# Patient Record
Sex: Male | Born: 1955 | Race: White | Hispanic: No | Smoking: Never smoker
Health system: Southern US, Community
[De-identification: ages and names within clinical notes are randomized; demographics above are authoritative.]

## PROBLEM LIST (undated history)

## (undated) DIAGNOSIS — F419 Anxiety disorder, unspecified: Secondary | ICD-10-CM

## (undated) DIAGNOSIS — F32A Depression, unspecified: Secondary | ICD-10-CM

## (undated) DIAGNOSIS — F329 Major depressive disorder, single episode, unspecified: Secondary | ICD-10-CM

## (undated) HISTORY — PX: HERNIA REPAIR: SHX51

---

## 2002-10-10 ENCOUNTER — Ambulatory Visit (HOSPITAL_COMMUNITY): Admission: RE | Admit: 2002-10-10 | Discharge: 2002-10-10 | Payer: Self-pay | Admitting: Internal Medicine

## 2006-06-03 ENCOUNTER — Observation Stay (HOSPITAL_COMMUNITY): Admission: EM | Admit: 2006-06-03 | Discharge: 2006-06-04 | Payer: Self-pay | Admitting: Emergency Medicine

## 2007-06-21 ENCOUNTER — Ambulatory Visit (HOSPITAL_COMMUNITY): Admission: RE | Admit: 2007-06-21 | Discharge: 2007-06-21 | Payer: Self-pay | Admitting: Family Medicine

## 2009-03-29 IMAGING — US US ABDOMEN COMPLETE
1 series · 14 of 25 positions shown · non-contrast
Comparison: none

HISTORY: Epigastric pain and pressure

ULTRASOUND ABDOMEN COMPLETE:
TECHNIQUE: Complete abdominal ultrasound examination was performed including
evaluation of the liver, gallbladder, bile ducts, pancreas, kidneys, spleen,
IVC, and abdominal aorta.

[Series 1: unknown · 0.37mm/px · 14 of 56 slices shown]
[im 1/56]
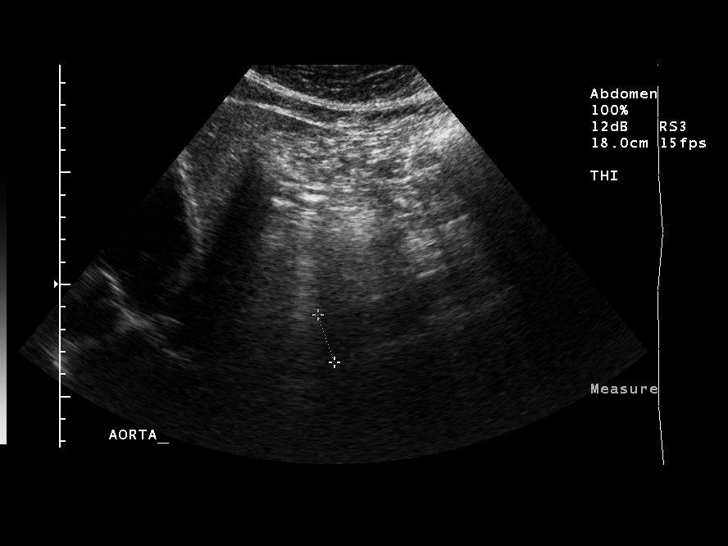
[im 5/56]
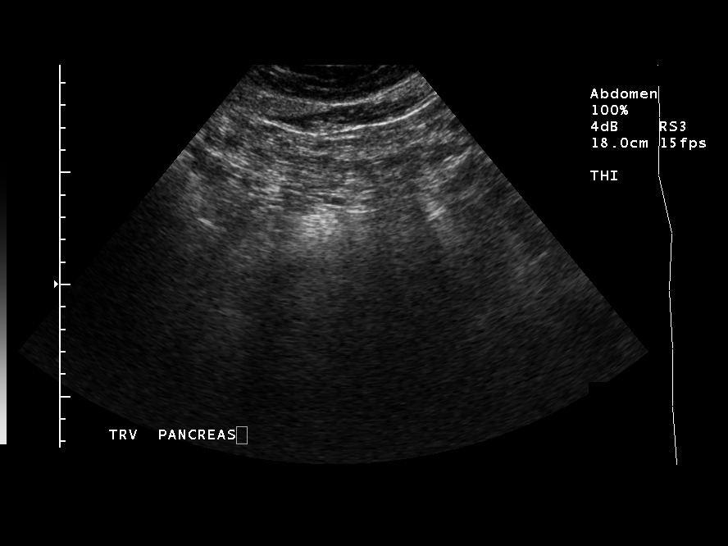
[im 10/56]
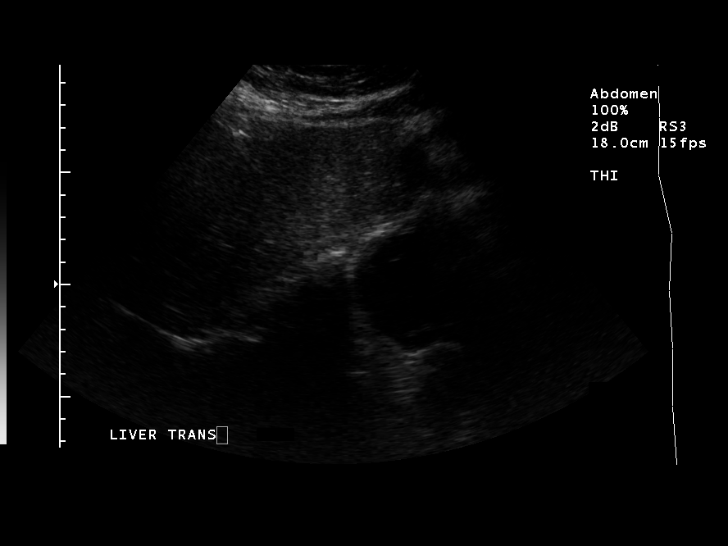
[im 14/56]
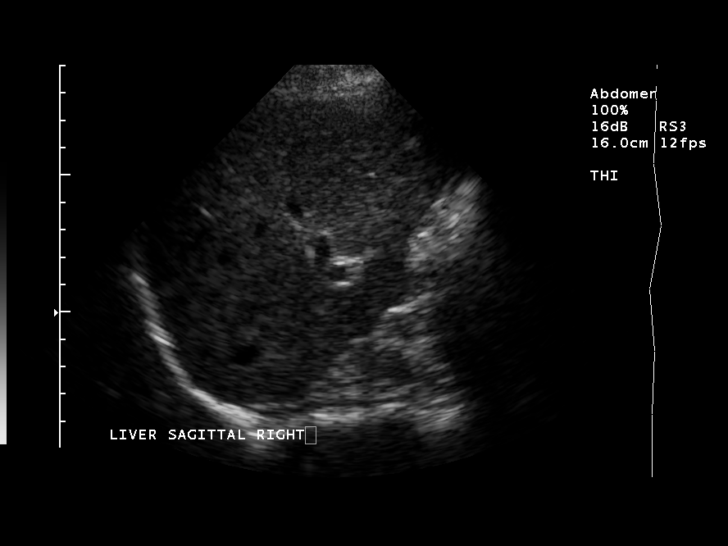
[im 19/56]
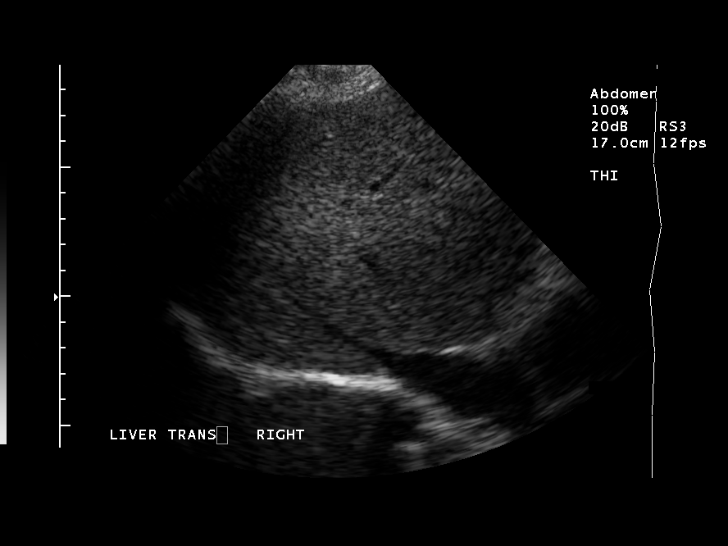
[im 21/56]
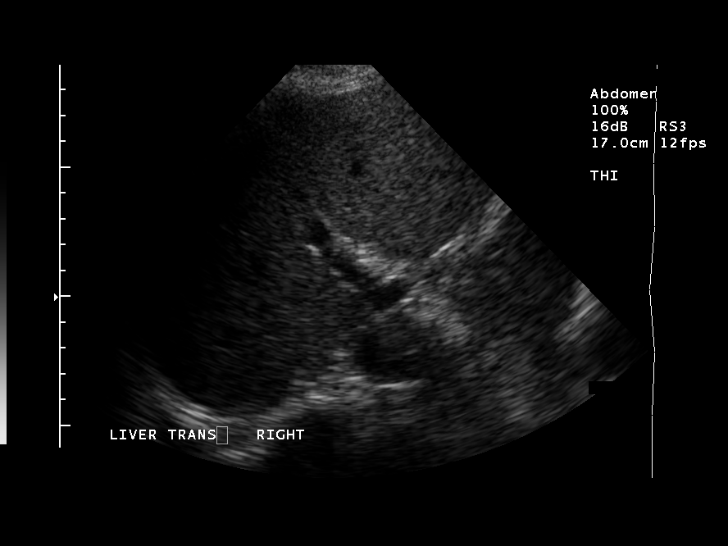
[im 26/56]
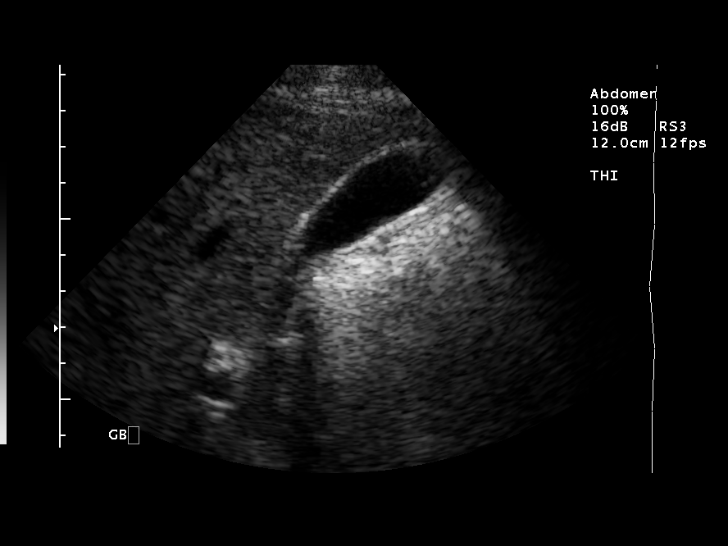
[im 30/56]
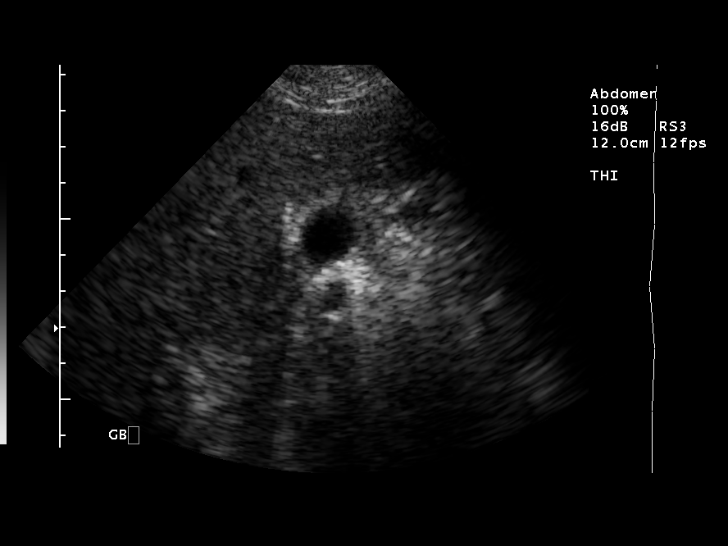
[im 35/56]
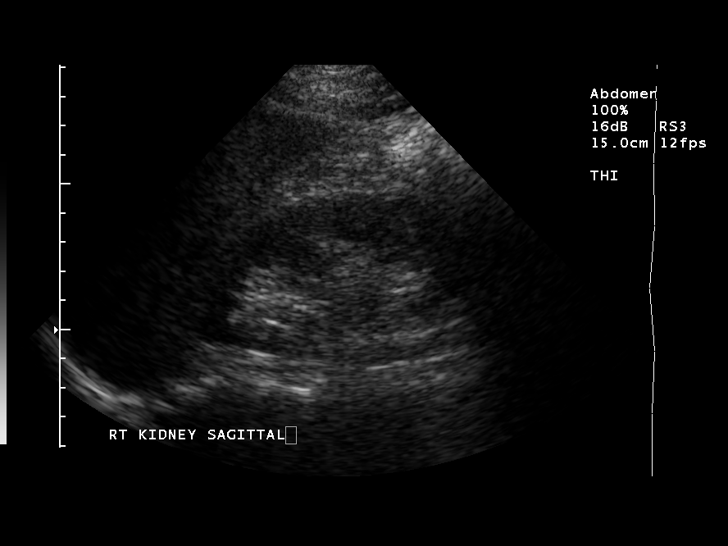
[im 37/56]
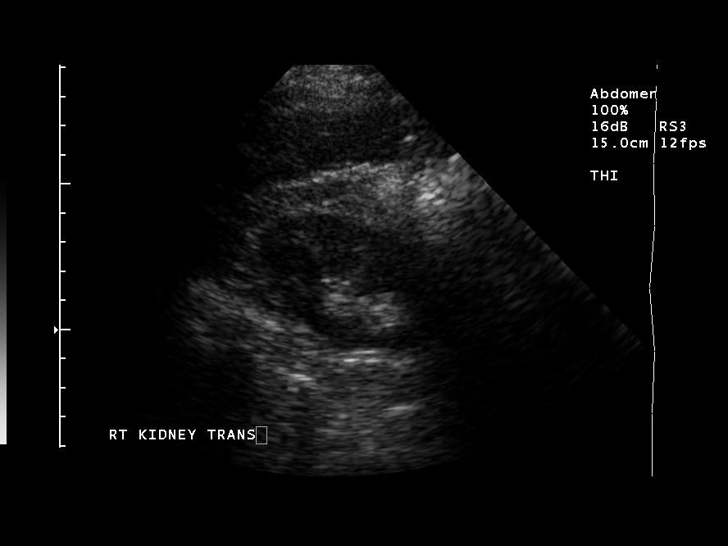
[im 42/56]
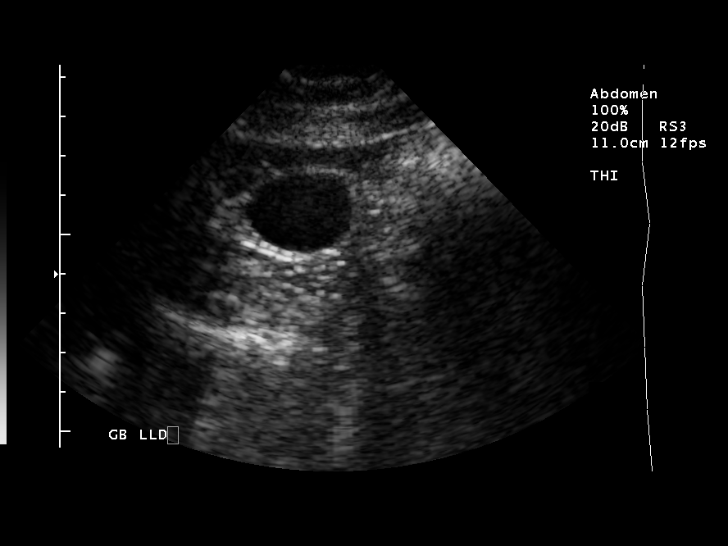
[im 46/56]
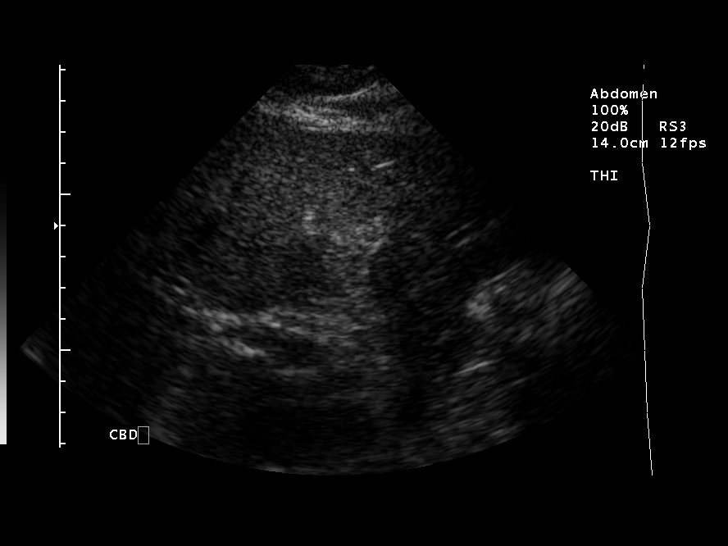
[im 51/56]
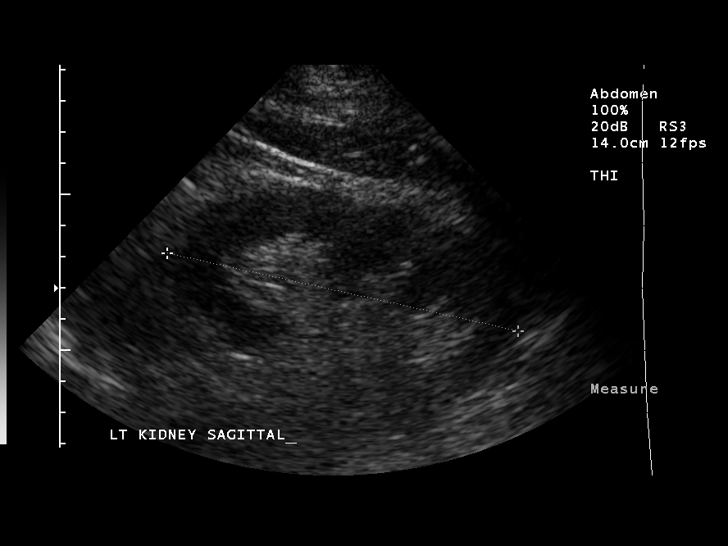
[im 56/56]
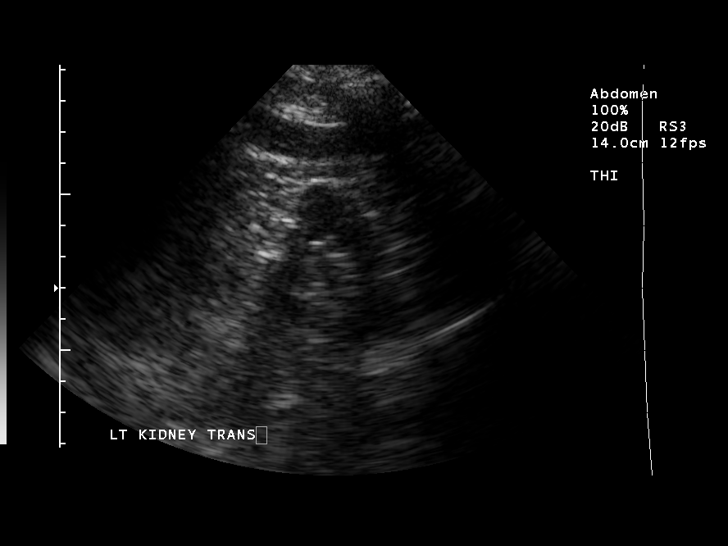

[14 of 25 positions shown; findings below may reference images not displayed]

Gallbladder normal without stones or wall thickening.
Common bile duct normal caliber 4 mm diameter.
Minimally increased hepatic echogenicity, question fatty infiltration, though
this can be seen with cirrhosis and certain infiltrative disorders.
Liver and spleen unremarkable, spleen 9.0 cm length.
Pancreas poorly visualized due to bowel gas.
Aorta and IVC normal.
Kidneys normal size and morphology, 11.6 cm length right and 11.5 cm length
left.
No free fluid.
IMPRESSION: Question mild fatty infiltration of liver.
Suboptimal pancreatic assessment.
No acute upper abdominal abnormalities.

## 2011-03-12 NOTE — Op Note (Signed)
NAME:  Peter Barajas, Peter Barajas                           ACCOUNT NO.:  [id number]   MEDICAL RECORD NO.:  [id number]                   PATIENT TYPE:  AMB   LOCATION:  DAY                                  FACILITY:  [facility]   PHYSICIAN:  R. Roetta Sessions, M.D.              DATE OF BIRTH:  [birth year]   DATE OF PROCEDURE:  10/10/2002  DATE OF DISCHARGE:                                 OPERATIVE REPORT   PROCEDURE:  Screening colonoscopy.   ENDOSCOPIST:  Gerrit Friends. Rourk, M.D.   INDICATIONS FOR PROCEDURE:  The patient is a 55 year old gentleman here for  intermittent low volume rectal bleeding.  He is Hemoccult positive.  He does  not have any upper GI tract symptoms.  He has not had any melena.  On  09/25/02 the CBC was completely normal through Dr. Alonza Smoker office.  H&H is  14.0 and 42.1, MCV is 85.2.  Colonoscopy is now being done to further  evaluate hematochezia.  This approach has been discussed with the patient.  The potential risks, benefits, and alternatives have been reviewed,  questions answered and he is agreeable.  ASA 1.  Please see my dictated H&P.   DESCRIPTION OF PROCEDURE:  O2 saturation, blood pressure, pulse and  respirations were monitored throughout the entire procedure.   CONSCIOUS SEDATION:  Versed 4 mg IV, Demerol 75 mg IV in divided doses.   INSTRUMENT:  Olympus video chip adult colonoscope.   FINDINGS:  Digital rectal exam revealed no abnormalities.   ENDOSCOPIC FINDINGS:  The prep was good.   RECTUM:  Examination of the rectal mucosa including the retroflex view of  the anal verge and en face view of the anal canal revealed some friable  internal hemorrhoidal tissue.  Otherwise the rectal mucosa appeared normal.   COLON:  The colonic mucosa was surveyed from the rectosigmoid junction  through the left transverse and right colon to the area of the appendiceal  orifice, ileocecal valve, and cecum.  These structures were well seen and  photographed for the record.   The patient was noted to have scattered left-  sided diverticula.  The remainder of the colonic mucosa appeared normal.   From the level of the cecum and ileocecal valve the scope was slowly  withdrawn.  All previously mentioned mucosal surfaces were again seen; and,  again, no other abnormalities were observed.  The patient tolerated the  procedure well and was reacted in endoscopy.   IMPRESSION:  1. Friable anal canal/internal hemorrhoids, otherwise normal rectum.  2. Left-sided diverticulum.  The remainder of the colonic mucosa appeared     normal.   DISCUSSION:  I suspect that the patient has bleed from hemorrhoids  intermittently recently.   RECOMMENDATIONS:  1. Diverticulosis and hemorrhoid literature provided to the patient.  2. A 10-day course of Anusol HC suppositories, 1 per rectum at bedtime.  3. Follow up with Dr. Ouida Sills.  Jonathon Bellows, M.D.    RMR/MEDQ  D:  10/10/2002  T:  10/10/2002  Job:  [id number]   cc:   Kingsley Callander. Ouida Sills, M.D.  [address]  [place]  [place] [address]  Fax: [phone]

## 2011-03-12 NOTE — Consult Note (Signed)
NAME:  Marquese, Burkland NO.:  [id number]   MEDICAL RECORD NO.:  [id number]                  PATIENT TYPE:   LOCATION:                                       FACILITY:   PHYSICIAN:  R. Roetta Sessions, M.D.              DATE OF BIRTH:  06-15-1956   DATE OF CONSULTATION:  10/03/2002  DATE OF DISCHARGE:                                   CONSULTATION   REASON FOR CONSULTATION:  Hematochezia, heme positive.   HISTORY OF PRESENT ILLNESS:  Mr. Lenvil Swaim is a 55 year old gentleman,  kindly referred by courtesy of  Dr. Carylon Perches to evaluate him for  intermittent  rectal bleeding over the past couple of months, was hemoccult  positive on rectal exam, had a small external hemorrhoid per Dr. Ouida Sills.  Mr.  Ihnen does not have diarrhea, constipation, has one formed bowel movement  daily, no abdominal pain or change in weight.  No upper GI tract symptoms  such as odynophagia, dysphagia or acid reflux symptoms, nausea or vomiting.  There is no family history of colorectal neoplasia.  There is no history of  gastrointestinal disease.  He does take ibuprofen and aspirin-containing  products on a regular basis, sometimes out of habit, but never takes more  than 1 dose daily per his report.  There has been no tarry or black stools.   PAST MEDICAL HISTORY:  Unremarkable for chronic illness.   PAST SURGICAL HISTORY:  None.   ALLERGIES:  No known drug allergies   CURRENT MEDICATIONS:  Over-the-counter agents at various times, Robitussin,  acetaminophen, dextromethorphan, Sudafed Severe Cold Remedy, Excedrin Quick,  caffeine tablets, ibuprofen, Alka Seltzer, St. John's Wort, lysine.   SOCIAL HISTORY:  The patient has been married for 18 years, has 4 children,  and he has worked in the [employer] for 20 years (is unemployed).  He  does not tobacco or alcohol abuse.   FAMILY HISTORY:  Mother and father are alive and in fairly good health, no  history of GI or  liver illness.   REVIEW OF SYSTEMS:  No chest pain or dyspnea on exertion, no change in  weight, no fever, chills.   PHYSICAL EXAMINATION:  A pleasant 55 year old gentleman resting resting comfortably.  He is bearded.  Weight 176, height 5 feet 10 inches, temperature 98.6, BP  120/70, pulse 72.  SKIN:  Warm and dry, no lesions. No cutaneous stigmata of chronic liver  disease.  HEENT EXAM:  Nares clear.  __________ not prominent.  CHEST:  Lungs are clear to auscultation.  CARDIAC EXAM:  Regular rate and rhythm without murmurs, rubs or gallops.  ABDOMEN:  Nondistended, positive bowel sounds, soft, nontender.  No  hepatosplenomegaly.  EXTREMITIES:  No edema.  RECTAL EXAM:  Deferred to time of colonoscopy.   IMPRESSION:  Alert, pleasant 55 year old gentleman with with intermittent small  amount of hematochezia, hemoccult positive on digital rectal  examination  needs to have a colonoscopy.  Discussed the procedure with Mr. Shives,  potential risks, benefits, alternatives __________ , questions answered.  We  will plan to perform colonoscopy in the near future at [facility].  Further recommendations to follow.   Thanks to Dr. Carylon Perches for letting me see this nice gentleman today.   ADDENDUM:  From Dr. Alonza Smoker office 09/25/2002.  CBC completely normal with  white count of 6.6, H&H is 14.0/42.1.  MCV 85.2.  Platelet count 338,000.                                                 Jonathon Bellows, M.D.    RMR/MEDQ  D:  10/03/2002  T:  10/03/2002  Job:  [id number]

## 2011-03-12 NOTE — H&P (Signed)
Peter Barajas, Peter Barajas                 ACCOUNT NO.:  [id number]   MEDICAL RECORD NO.:  [id number]          PATIENT TYPE:  INP   LOCATION:  A214                          FACILITY:  [facility]   PHYSICIAN:  Melvyn Novas, MDDATE OF BIRTH:  1956/01/07   DATE OF ADMISSION:  06/03/2006  DATE OF DISCHARGE:  LH                                HISTORY & PHYSICAL   HISTORY OF PRESENT ILLNESS:  The patient is a 55 year old patient of Dr.  Freida Busman who presented to the emergency room when he was awoken from sleep  with an episode of sharp chest pain, racing palpitations, neck tightening,  some diaphoresis, and anxiety.  He has a history of hyperlipidemia but no  other significant risk factors for coronary artery disease.  The patient was  admitted through the emergency room to the ICU and placed on antiplatelet  therapy, anticoagulation, and beta-blockade.  The patient did well.  Serial  enzymes were negative over 24-36 hours.  He was hemodynamically stable, and  he will be admitted for observation.   PAST MEDICAL HISTORY:  Mild hyperlipidemia; otherwise, unremarkable.   PAST SURGICAL HISTORY:  Left herniorrhaphy as a child.   ALLERGIES:  NO KNOWN DRUG ALLERGIES.   MEDICATIONS:  He takes no medicines.   SOCIAL HISTORY:  He is a nonsmoker.  He works as a Psychologist, forensic at  [employer].  He is married with four children.  Significant  family pressures.   PHYSICAL EXAMINATION:  VITAL SIGNS:  Blood pressure 112/84, pulse 80 and  regular, afebrile.  HEENT:  Normocephalic, atraumatic.  PERRLA.  Anicteric sclerae.  Conjunctivae pink.  NECK:  No JVD, no carotid bruits, no thyromegaly or thyroid bruits.  LUNGS:  Clear to A&P.  No wheezing, rhonchi, or rales.  HEART:  Regular rhythm.  S1 and S2 with normal ___________  No murmurs,  gallops, heaves, thrills, or rubs.  ABDOMEN:  Soft, nontender.  Bowel sounds normoactive.  No masses or  organomegaly.  EXTREMITIES:  No clubbing,  cyanosis, or edema.   IMPRESSION:  1. Chest pain, rule out myocardial infarction.  2. Mild hyperlipidemia.  3. Possible panic disorder.   PLAN:  Admit as per orders.      Melvyn Novas, MD  Electronically Signed     RMD/MEDQ  D:  06/04/2006  T:  06/04/2006  Job:  [id number]

## 2011-03-12 NOTE — Discharge Summary (Signed)
[name], Peter Barajas                 ACCOUNT NO.:  [id number]   MEDICAL RECORD NO.:  [id number]          PATIENT TYPE:  OBV   LOCATION:  A214                          FACILITY:  [facility]   PHYSICIAN:  Melvyn Novas, MDDATE OF BIRTH:  [birth year]   DATE OF ADMISSION:  06/03/2006  DATE OF DISCHARGE:  08/11/2007LH                                 DISCHARGE SUMMARY   The patient is a 55 year old white male admitted for second episode of sharp  chest pain and palpitations, some anxiety symptoms.  He has history of mild  hyperlipidemia, otherwise no significant risk factors of coronary artery  disease.   He was admitted to ICU on anti-platelet, anticoagulation therapy.  Cardiac  enzymes were ruled out and in talking to the patient further there was no  significant risk factors.  He was advised it may have been a panic episode.  He was given Paxil 20 mg per day on discharge.  Advised to read the book,  'Don't Panic.'  He and his wife understand that he is to follow up with Dr.  Sudie Bailey.  I also discussed the possibility of doing an exercise tolerance  test for the patient's benefit for the possible next episode of panic to be  certain he has no coronary component to his chest pain.      Melvyn Novas, MD  Electronically Signed     RMD/MEDQ  D:  06/04/2006  T:  06/05/2006  Job:  [id number]   cc:   Mila Homer. Sudie Bailey, M.D.  Fax: [phone]

## 2014-11-04 ENCOUNTER — Encounter (INDEPENDENT_AMBULATORY_CARE_PROVIDER_SITE_OTHER): Payer: Self-pay | Admitting: *Deleted

## 2014-11-08 ENCOUNTER — Telehealth: Payer: Self-pay

## 2014-11-08 NOTE — Telephone Encounter (Signed)
Pt was referred by Dr. Sudie BaileyKnowlton. Referred for screening colonoscopy. ( LLQ abdominal pain. LMOM to call. ( Needs OV appt)

## 2014-11-13 ENCOUNTER — Other Ambulatory Visit (INDEPENDENT_AMBULATORY_CARE_PROVIDER_SITE_OTHER): Payer: Self-pay | Admitting: *Deleted

## 2014-11-13 ENCOUNTER — Encounter (INDEPENDENT_AMBULATORY_CARE_PROVIDER_SITE_OTHER): Payer: Self-pay | Admitting: *Deleted

## 2014-11-13 DIAGNOSIS — Z1211 Encounter for screening for malignant neoplasm of colon: Secondary | ICD-10-CM

## 2014-11-25 ENCOUNTER — Telehealth (INDEPENDENT_AMBULATORY_CARE_PROVIDER_SITE_OTHER): Payer: Self-pay | Admitting: *Deleted

## 2014-11-25 DIAGNOSIS — Z1211 Encounter for screening for malignant neoplasm of colon: Secondary | ICD-10-CM

## 2014-11-25 NOTE — Telephone Encounter (Signed)
Patient needs trilyte 

## 2014-11-28 MED ORDER — PEG 3350-KCL-NA BICARB-NACL 420 G PO SOLR: 4000.0000 mL | Freq: Once | ORAL | Status: DC

## 2014-11-29 NOTE — Telephone Encounter (Signed)
Pt is scheduled with Dr. Karilyn Cotaehman for colonoscopy on 01/22/2015.

## 2014-12-24 ENCOUNTER — Telehealth (INDEPENDENT_AMBULATORY_CARE_PROVIDER_SITE_OTHER): Payer: Self-pay | Admitting: *Deleted

## 2014-12-24 NOTE — Telephone Encounter (Signed)
agree

## 2014-12-24 NOTE — Telephone Encounter (Signed)
Referring MD/PCP: knowlton   Procedure: tcs  Reason/Indication:  screening  Has patient had this procedure before?  Yes, 2003 -- epic  If so, when, by whom and where?    Is there a family history of colon cancer?  Yes, maternal uncle  Who?  What age when diagnosed?    Is patient diabetic?   no      Does patient have prosthetic heart valve?  no  Do you have a pacemaker?  no  Has patient ever had endocarditis? no  Has patient had joint replacement within last 12 months?  no  Does patient tend to be constipated or take laxatives? no  Is patient on Coumadin, Plavix and/or Aspirin? no  Medications: paroxetine 20 mg daily, clonazepam 1 mg qid  Allergies: nkda  Medication Adjustment:   Procedure date & time: 01/22/15 at 730

## 2015-01-22 ENCOUNTER — Ambulatory Visit (HOSPITAL_COMMUNITY)
Admission: RE | Admit: 2015-01-22 | Discharge: 2015-01-22 | Disposition: A | Discharge status: Home / Self Care | Payer: BC Managed Care – PPO | Source: Ambulatory Visit | Attending: Internal Medicine | Admitting: Internal Medicine

## 2015-01-22 ENCOUNTER — Encounter (HOSPITAL_COMMUNITY): Payer: Self-pay | Admitting: *Deleted

## 2015-01-22 ENCOUNTER — Encounter (HOSPITAL_COMMUNITY)
Admission: RE | Disposition: A | Discharge status: Home / Self Care | Payer: Self-pay | Source: Ambulatory Visit | Attending: Internal Medicine

## 2015-01-22 DIAGNOSIS — K648 Other hemorrhoids: Secondary | ICD-10-CM | POA: Insufficient documentation

## 2015-01-22 DIAGNOSIS — Z1211 Encounter for screening for malignant neoplasm of colon: Secondary | ICD-10-CM

## 2015-01-22 DIAGNOSIS — Z800 Family history of malignant neoplasm of digestive organs: Secondary | ICD-10-CM | POA: Insufficient documentation

## 2015-01-22 DIAGNOSIS — F419 Anxiety disorder, unspecified: Secondary | ICD-10-CM | POA: Insufficient documentation

## 2015-01-22 DIAGNOSIS — K5730 Diverticulosis of large intestine without perforation or abscess without bleeding: Secondary | ICD-10-CM | POA: Diagnosis not present

## 2015-01-22 DIAGNOSIS — F329 Major depressive disorder, single episode, unspecified: Secondary | ICD-10-CM | POA: Diagnosis not present

## 2015-01-22 HISTORY — DX: Anxiety disorder, unspecified: F41.9

## 2015-01-22 HISTORY — DX: Depression, unspecified: F32.A

## 2015-01-22 HISTORY — PX: COLONOSCOPY: SHX5424

## 2015-01-22 HISTORY — DX: Major depressive disorder, single episode, unspecified: F32.9

## 2015-01-22 SURGERY — COLONOSCOPY
Anesthesia: Moderate Sedation

## 2015-01-22 MED ORDER — SODIUM CHLORIDE 0.9 % IV SOLN
INTRAVENOUS | Status: DC
Start: 2015-01-22 — End: 2015-01-22
  Administered 2015-01-22: 07:00:00 via INTRAVENOUS

## 2015-01-22 MED ORDER — MEPERIDINE HCL 50 MG/ML IJ SOLN
INTRAMUSCULAR | Status: DC | PRN
  Administered 2015-01-22 (×2): 25 mg via INTRAVENOUS

## 2015-01-22 MED ORDER — MEPERIDINE HCL 50 MG/ML IJ SOLN
INTRAMUSCULAR | Status: AC
  Filled 2015-01-22: qty 1

## 2015-01-22 MED ORDER — MIDAZOLAM HCL 5 MG/5ML IJ SOLN
INTRAMUSCULAR | Status: AC
  Filled 2015-01-22: qty 5

## 2015-01-22 MED ORDER — MIDAZOLAM HCL 5 MG/5ML IJ SOLN
INTRAMUSCULAR | Status: DC | PRN
  Administered 2015-01-22 (×2): 3 mg via INTRAVENOUS
  Administered 2015-01-22 (×2): 2 mg via INTRAVENOUS
  Administered 2015-01-22: 3 mg via INTRAVENOUS
  Administered 2015-01-22: 2 mg via INTRAVENOUS

## 2015-01-22 MED ORDER — STERILE WATER FOR IRRIGATION IR SOLN
Status: DC | PRN
  Administered 2015-01-22: 08:00:00

## 2015-01-22 MED ORDER — MIDAZOLAM HCL 5 MG/5ML IJ SOLN
INTRAMUSCULAR | Status: AC
  Filled 2015-01-22: qty 10

## 2015-01-22 NOTE — H&P (Signed)
Peter Barajas is an 59 y.o. male.   Chief Complaint: Patient is here for colonoscopy. HPI: Patient is 59 year old Caucasian male who is here for screening colonoscopy. He denies abdominal pain change in bowel habits or rectal bleeding. Family history is negative for CRC in a first-degree relative. His maternal uncle had colon carcinoma in her 2760s and died of unrelated causes.  Past Medical History  Diagnosis Date  . Anxiety   . Depression     Past Surgical History  Procedure Laterality Date  . Hernia repair      at age 89    Family History  Problem Relation Age of Onset  . Colon cancer Other    Social History:  reports that he has never smoked. He does not have any smokeless tobacco history on file. He reports that he drinks about 8.4 oz of alcohol per week. He reports that he does not use illicit drugs.  Allergies: No Known Allergies  Medications Prior to Admission  Medication Sig Dispense Refill  . clonazePAM (KLONOPIN) 1 MG tablet Take 1 mg by mouth 4 (four) times daily.    Marland Kitchen. PARoxetine (PAXIL) 20 MG tablet Take 20 mg by mouth daily.    . polyethylene glycol-electrolytes (NULYTELY/GOLYTELY) 420 G solution Take 4,000 mLs by mouth once. 4000 mL 0    No results found for this or any previous visit (from the past 48 hour(s)). No results found.  ROS  Blood pressure 128/93, pulse 64, temperature 97.8 F (36.6 C), temperature source Oral, resp. rate 18, height 5\' 7"  (1.702 m), weight 182 lb (82.555 kg), SpO2 99 %. Physical Exam  Constitutional: He appears well-developed and well-nourished.  HENT:  Mouth/Throat: Oropharynx is clear and moist.  Eyes: Conjunctivae are normal. No scleral icterus.  Neck: No thyromegaly present.  Cardiovascular: Normal rate, regular rhythm and normal heart sounds.   No murmur heard. Respiratory: Effort normal and breath sounds normal.  GI: Soft. He exhibits no distension. There is no tenderness.  Musculoskeletal: He exhibits no edema.   Lymphadenopathy:    He has no cervical adenopathy.  Neurological: He is alert.  Skin: Skin is warm and dry.     Assessment/Plan Average risk screening colonoscopy.  REHMAN,NAJEEB U 01/22/2015, 7:27 AM

## 2015-01-22 NOTE — Op Note (Signed)
COLONOSCOPY PROCEDURE REPORT  PATIENT:  Peter Barajas  MR#:  [id number] Birthdate:  03-Aug-1956, 59 y.o., male Endoscopist:  Dr. Malissa HippoNajeeb U. Rufus Cypert, MD Referred By:  Dr. Milana ObeyStephen D Knowlton, MD  Procedure Date: 01/22/2015  Procedure:   Colonoscopy  Indications: Patient is [age] old Caucasian male who is undergoing average risk screening colonoscopy.  Informed Consent:  The procedure and risks were reviewed with the patient and informed consent was obtained.  Medications:  Demerol 50 mg IV Versed 15 mg IV  Description of procedure:  After a digital rectal exam was performed, that colonoscope was advanced from the anus through the rectum and colon to the area of the cecum, ileocecal valve and appendiceal orifice. The cecum was deeply intubated. These structures were well-seen and photographed for the record. From the level of the cecum and ileocecal valve, the scope was slowly and cautiously withdrawn. The mucosal surfaces were carefully surveyed utilizing scope tip to flexion to facilitate fold flattening as needed. The scope was pulled down into the rectum where a thorough exam including retroflexion was performed. Terminal ileum was also examined.  Findings:   Prep excellent. Normal mucosa of terminal ileum. Moderate number of diverticula at sigmoid colon. Normal rectal mucosa. Hemorrhoids noted above and below the dentate line.   Therapeutic/Diagnostic Maneuvers Performed:  None  Complications:  None  Cecal Withdrawal Time:  10 minutes  Impression:  Normal mucosa of terminal ileum. Moderate sigmoid colon diverticulosis. Small internal/external hemorrhoids. No evidence of colonic polyps.  Recommendations:  Standard instructions given. High fiber diet. Next screening exam in 10 years.  Emelina Hinch U  01/22/2015 8:08 AM  CC: Dr. Milana ObeyKNOWLTON,STEPHEN D, MD & Dr. Bonnetta BarryNo ref. provider found

## 2015-01-22 NOTE — Discharge Instructions (Signed)
Resume usual medications and high fiber diet. °No driving for 24 hours. °Next screening exam in 10 years ° °High-Fiber Diet °Fiber is found in fruits, vegetables, and grains. A high-fiber diet encourages the addition of more whole grains, legumes, fruits, and vegetables in your diet. The recommended amount of fiber for adult males is 38 g per day. For adult females, it is 25 g per day. Pregnant and lactating women should get 28 g of fiber per day. If you have a digestive or bowel problem, ask your caregiver for advice before adding high-fiber foods to your diet. Eat a variety of high-fiber foods instead of only a select few type of foods.  °PURPOSE °· To increase stool bulk. °· To make bowel movements more regular to prevent constipation. °· To lower cholesterol. °· To prevent overeating. °WHEN IS THIS DIET USED? °· It may be used if you have constipation and hemorrhoids. °· It may be used if you have uncomplicated diverticulosis (intestine condition) and irritable bowel syndrome. °· It may be used if you need help with weight management. °· It may be used if you want to add it to your diet as a protective measure against atherosclerosis, diabetes, and cancer. °SOURCES OF FIBER °· Whole-grain breads and cereals. °· Fruits, such as apples, oranges, bananas, berries, prunes, and pears. °· Vegetables, such as green peas, carrots, sweet potatoes, beets, broccoli, cabbage, spinach, and artichokes. °· Legumes, such split peas, soy, lentils. °· Almonds. °FIBER CONTENT IN FOODS °Starches and Grains / Dietary Fiber (g) °· Cheerios, 1 cup / 3 g °· Corn Flakes cereal, 1 cup / 0.7 g °· Rice crispy treat cereal, 1¼ cup / 0.3 g °· Instant oatmeal (cooked), ½ cup / 2 g °· Frosted wheat cereal, 1 cup / 5.1 g °· Brown, long-grain rice (cooked), 1 cup / 3.5 g °· White, long-grain rice (cooked), 1 cup / 0.6 g °· Enriched macaroni (cooked), 1 cup / 2.5 g °Legumes / Dietary Fiber (g) °· Baked beans (canned, plain, or vegetarian), ½ cup  / 5.2 g °· Kidney beans (canned), ½ cup / 6.8 g °· Pinto beans (cooked), ½ cup / 5.5 g °Breads and Crackers / Dietary Fiber (g) °· Plain or honey graham crackers, 2 squares / 0.7 g °· Saltine crackers, 3 squares / 0.3 g °· Plain, salted pretzels, 10 pieces / 1.8 g °· Whole-wheat bread, 1 slice / 1.9 g °· White bread, 1 slice / 0.7 g °· Raisin bread, 1 slice / 1.2 g °· Plain bagel, 3 oz / 2 g °· Flour tortilla, 1 oz / 0.9 g °· Corn tortilla, 1 small / 1.5 g °· Hamburger or hotdog bun, 1 small / 0.9 g °Fruits / Dietary Fiber (g) °· Apple with skin, 1 medium / 4.4 g °· Sweetened applesauce, ½ cup / 1.5 g °· Banana, ½ medium / 1.5 g °· Grapes, 10 grapes / 0.4 g °· Orange, 1 small / 2.3 g °· Raisin, 1.5 oz / 1.6 g °· Melon, 1 cup / 1.4 g °Vegetables / Dietary Fiber (g) °· Green beans (canned), ½ cup / 1.3 g °· Carrots (cooked), ½ cup / 2.3 g °· Broccoli (cooked), ½ cup / 2.8 g °· Peas (cooked), ½ cup / 4.4 g °· Mashed potatoes, ½ cup / 1.6 g °· Lettuce, 1 cup / 0.5 g °· Corn (canned), ½ cup / 1.6 g °· Tomato, ½ cup / 1.1 g °Document Released: 10/11/2005 Document Revised: 04/11/2012 Document Reviewed: 01/13/2012 °ExitCare® Patient Information ©  2015 [employer]. This information is not intended to replace advice given to you by your health care provider. Make sure you discuss any questions you have with your health care provider. °Colonoscopy, Care After °These instructions give you information on caring for yourself after your procedure. Your doctor may also give you more specific instructions. Call your doctor if you have any problems or questions after your procedure. °HOME CARE °· Do not drive for 24 hours. °· Do not sign important papers or use machinery for 24 hours. °· You may shower. °· You may go back to your usual activities, but go slower for the first 24 hours. °· Take rest breaks often during the first 24 hours. °· Walk around or use warm packs on your belly (abdomen) if you have belly cramping or  gas. °· Drink enough fluids to keep your pee (urine) clear or pale yellow. °· Resume your normal diet. Avoid heavy or fried foods. °· Avoid drinking alcohol for 24 hours or as told by your doctor. °· Only take medicines as told by your doctor. °If a tissue sample (biopsy) was taken during the procedure:  °· Do not take aspirin or blood thinners for 7 days, or as told by your doctor. °· Do not drink alcohol for 7 days, or as told by your doctor. °· Eat soft foods for the first 24 hours. °GET HELP IF: °You still have a small amount of blood in your poop (stool) 2-3 days after the procedure. °GET HELP RIGHT AWAY IF: °· You have more than a small amount of blood in your poop. °· You see clumps of tissue (blood clots) in your poop. °· Your belly is puffy (swollen). °· You feel sick to your stomach (nauseous) or throw up (vomit). °· You have a fever. °· You have belly pain that gets worse and medicine does not help. °MAKE SURE YOU: °· Understand these instructions. °· Will watch your condition. °· Will get help right away if you are not doing well or get worse. °Document Released: 11/13/2010 Document Revised: 10/16/2013 Document Reviewed: 06/18/2013 °ExitCare® Patient Information ©2015 [employer]. This information is not intended to replace advice given to you by your health care provider. Make sure you discuss any questions you have with your health care provider. ° °

## 2015-01-23 ENCOUNTER — Encounter (HOSPITAL_COMMUNITY): Payer: Self-pay | Admitting: Internal Medicine

## 2019-12-30 ENCOUNTER — Ambulatory Visit: Payer: Self-pay | Attending: Internal Medicine

## 2019-12-30 DIAGNOSIS — Z23 Encounter for immunization: Secondary | ICD-10-CM | POA: Insufficient documentation

## 2019-12-30 NOTE — Progress Notes (Signed)
   Covid-19 Vaccination Clinic  Name:  Peter Barajas    MRN: [id number] DOB: October 06, 1956  12/30/2019  [name] was observed post Covid-19 immunization for 15 minutes without incident. He was provided with Vaccine Information Sheet and instruction to access the V-Safe system.   [name] was instructed to call 911 with any severe reactions post vaccine: Marland Kitchen Difficulty breathing  . Swelling of face and throat  . A fast heartbeat  . A bad rash all over body  . Dizziness and weakness   Immunizations Administered    Name Date Dose VIS Date Route   Pfizer COVID-19 Vaccine 12/30/2019  5:14 PM 0.3 mL 10/05/2019 Intramuscular   Manufacturer: [employer], [employer]   Lot: [id number]   NDC: [address]-7

## 2020-01-20 ENCOUNTER — Ambulatory Visit: Payer: Self-pay | Attending: Internal Medicine

## 2020-01-20 DIAGNOSIS — Z23 Encounter for immunization: Secondary | ICD-10-CM

## 2020-01-20 NOTE — Progress Notes (Signed)
   Covid-19 Vaccination Clinic  Name:  Peter Barajas    MRN: [id number] DOB: 09/03/1956  01/20/2020  [name] was observed post Covid-19 immunization for 15 minutes without incident. He was provided with Vaccine Information Sheet and instruction to access the V-Safe system.   [name] was instructed to call 911 with any severe reactions post vaccine: Marland Kitchen Difficulty breathing  . Swelling of face and throat  . A fast heartbeat  . A bad rash all over body  . Dizziness and weakness   Immunizations Administered    Name Date Dose VIS Date Route   Pfizer COVID-19 Vaccine 01/20/2020  4:58 PM 0.3 mL 10/05/2019 Intramuscular   Manufacturer: [employer], [employer]   Lot: [id number]   NDC: [address]-2

## 2021-02-23 ENCOUNTER — Inpatient Hospital Stay (HOSPITAL_COMMUNITY)
Admission: EM | Admit: 2021-02-23 | Discharge: 2021-02-24 | DRG: 342 | Disposition: A | Discharge status: Home / Self Care | Payer: Self-pay | Attending: General Surgery | Admitting: General Surgery

## 2021-02-23 ENCOUNTER — Encounter (HOSPITAL_COMMUNITY): Payer: Self-pay | Admitting: Emergency Medicine

## 2021-02-23 ENCOUNTER — Ambulatory Visit (HOSPITAL_COMMUNITY)
Admission: RE | Admit: 2021-02-23 | Discharge: 2021-02-23 | Disposition: A | Discharge status: Home / Self Care | Payer: Self-pay | Source: Ambulatory Visit | Attending: Family Medicine | Admitting: Family Medicine

## 2021-02-23 ENCOUNTER — Other Ambulatory Visit: Payer: Self-pay | Admitting: Family Medicine

## 2021-02-23 ENCOUNTER — Other Ambulatory Visit: Payer: Self-pay

## 2021-02-23 DIAGNOSIS — K3580 Unspecified acute appendicitis: Secondary | ICD-10-CM

## 2021-02-23 DIAGNOSIS — R109 Unspecified abdominal pain: Secondary | ICD-10-CM | POA: Insufficient documentation

## 2021-02-23 DIAGNOSIS — F419 Anxiety disorder, unspecified: Secondary | ICD-10-CM | POA: Diagnosis present

## 2021-02-23 DIAGNOSIS — F32A Depression, unspecified: Secondary | ICD-10-CM | POA: Diagnosis present

## 2021-02-23 DIAGNOSIS — Z79899 Other long term (current) drug therapy: Secondary | ICD-10-CM

## 2021-02-23 DIAGNOSIS — K353 Acute appendicitis with localized peritonitis, without perforation or gangrene: Principal | ICD-10-CM

## 2021-02-23 DIAGNOSIS — E871 Hypo-osmolality and hyponatremia: Secondary | ICD-10-CM | POA: Diagnosis present

## 2021-02-23 DIAGNOSIS — Z20822 Contact with and (suspected) exposure to covid-19: Secondary | ICD-10-CM | POA: Diagnosis present

## 2021-02-23 LAB — CBC WITH DIFFERENTIAL/PLATELET
Abs Immature Granulocytes: 0.03 10*3/uL (ref 0.00–0.07)
Basophils Absolute: 0 10*3/uL (ref 0.0–0.1)
Basophils Relative: 1 %
Eosinophils Absolute: 0.1 10*3/uL (ref 0.0–0.5)
Eosinophils Relative: 1 %
HCT: 39 % (ref 39.0–52.0)
Hemoglobin: 13.2 g/dL (ref 13.0–17.0)
Immature Granulocytes: 0 %
Lymphocytes Relative: 22 %
Lymphs Abs: 1.7 10*3/uL (ref 0.7–4.0)
MCH: 31.4 pg (ref 26.0–34.0)
MCHC: 33.8 g/dL (ref 30.0–36.0)
MCV: 92.9 fL (ref 80.0–100.0)
Monocytes Absolute: 0.6 10*3/uL (ref 0.1–1.0)
Monocytes Relative: 8 %
Neutro Abs: 5.3 10*3/uL (ref 1.7–7.7)
Neutrophils Relative %: 68 %
Platelets: 200 10*3/uL (ref 150–400)
RBC: 4.2 MIL/uL — ABNORMAL LOW (ref 4.22–5.81)
RDW: 12.9 % (ref 11.5–15.5)
WBC: 7.8 10*3/uL (ref 4.0–10.5)
nRBC: 0 % (ref 0.0–0.2)

## 2021-02-23 LAB — COMPREHENSIVE METABOLIC PANEL
ALT: 22 U/L (ref 0–44)
AST: 21 U/L (ref 15–41)
Albumin: 4.6 g/dL (ref 3.5–5.0)
Alkaline Phosphatase: 102 U/L (ref 38–126)
Anion gap: 10 (ref 5–15)
BUN: 10 mg/dL (ref 8–23)
CO2: 27 mmol/L (ref 22–32)
Calcium: 9.6 mg/dL (ref 8.9–10.3)
Chloride: 96 mmol/L — ABNORMAL LOW (ref 98–111)
Creatinine, Ser: 0.93 mg/dL (ref 0.61–1.24)
GFR, Estimated: >60 mL/min (ref >60)
Glucose, Bld: 95 mg/dL (ref 70–99)
Potassium: 4.2 mmol/L (ref 3.5–5.1)
Sodium: 133 mmol/L — ABNORMAL LOW (ref 135–145)
Total Bilirubin: 1 mg/dL (ref 0.3–1.2)
Total Protein: 7.9 g/dL (ref 6.5–8.1)

## 2021-02-23 LAB — RESP PANEL BY RT-PCR (FLU A&B, COVID) ARPGX2
Influenza A by PCR: NEGATIVE
Influenza B by PCR: NEGATIVE
SARS Coronavirus 2 by RT PCR: NEGATIVE

## 2021-02-23 LAB — POCT I-STAT CREATININE: Creatinine, Ser: 0.9 mg/dL (ref 0.61–1.24)

## 2021-02-23 MED ORDER — METRONIDAZOLE 500 MG/100ML IV SOLN
500.0000 mg | Freq: Three times a day (TID) | INTRAVENOUS | Status: DC
  Administered 2021-02-24: 500 mg via INTRAVENOUS
  Filled 2021-02-23: qty 100

## 2021-02-23 MED ORDER — SODIUM CHLORIDE 0.9 % IV SOLN: 2.0000 g | INTRAVENOUS | Status: DC

## 2021-02-23 MED ORDER — IOHEXOL 300 MG/ML  SOLN
100.0000 mL | Freq: Once | INTRAMUSCULAR | Status: AC | PRN
  Administered 2021-02-23: 100 mL via INTRAVENOUS

## 2021-02-23 MED ORDER — IOHEXOL 9 MG/ML PO SOLN
ORAL | Status: AC
  Filled 2021-02-23: qty 1000

## 2021-02-23 MED ORDER — OXYCODONE HCL 5 MG PO TABS
5.0000 mg | ORAL_TABLET | ORAL | Status: DC | PRN
  Administered 2021-02-24 (×2): 5 mg via ORAL
  Filled 2021-02-23 (×2): qty 1

## 2021-02-23 MED ORDER — SODIUM CHLORIDE 0.9 % IV SOLN: INTRAVENOUS | Status: DC

## 2021-02-23 MED ORDER — HYDROMORPHONE HCL 1 MG/ML IJ SOLN
1.0000 mg | Freq: Once | INTRAMUSCULAR | Status: AC
Start: 2021-02-23 — End: 2021-02-23
  Administered 2021-02-23: 1 mg via INTRAVENOUS
  Filled 2021-02-23: qty 1

## 2021-02-23 MED ORDER — CLONAZEPAM 0.5 MG PO TABS
1.0000 mg | ORAL_TABLET | Freq: Four times a day (QID) | ORAL | Status: DC
  Administered 2021-02-23 – 2021-02-24 (×3): 1 mg via ORAL
  Filled 2021-02-23 (×3): qty 2

## 2021-02-23 MED ORDER — ACETAMINOPHEN 650 MG RE SUPP
650.0000 mg | Freq: Four times a day (QID) | RECTAL | Status: DC | PRN
Start: 2021-02-23 — End: 2021-02-24

## 2021-02-23 MED ORDER — ONDANSETRON HCL 4 MG PO TABS: 4.0000 mg | ORAL_TABLET | Freq: Four times a day (QID) | ORAL | Status: DC | PRN

## 2021-02-23 MED ORDER — ACETAMINOPHEN 325 MG PO TABS: 650.0000 mg | ORAL_TABLET | Freq: Four times a day (QID) | ORAL | Status: DC | PRN

## 2021-02-23 MED ORDER — ONDANSETRON HCL 4 MG/2ML IJ SOLN: 4.0000 mg | Freq: Four times a day (QID) | INTRAMUSCULAR | Status: DC | PRN

## 2021-02-23 MED ORDER — HEPARIN SODIUM (PORCINE) 5000 UNIT/ML IJ SOLN
5000.0000 [IU] | Freq: Three times a day (TID) | INTRAMUSCULAR | Status: DC
  Administered 2021-02-23 – 2021-02-24 (×2): 5000 [IU] via SUBCUTANEOUS
  Filled 2021-02-23 (×2): qty 1

## 2021-02-23 MED ORDER — METRONIDAZOLE 500 MG/100ML IV SOLN
500.0000 mg | Freq: Once | INTRAVENOUS | Status: AC
  Administered 2021-02-23: 500 mg via INTRAVENOUS
  Filled 2021-02-23: qty 100

## 2021-02-23 MED ORDER — SODIUM CHLORIDE 0.9 % IV SOLN
2.0000 g | Freq: Once | INTRAVENOUS | Status: AC
  Administered 2021-02-23: 2 g via INTRAVENOUS
  Filled 2021-02-23: qty 20

## 2021-02-23 MED ORDER — MORPHINE SULFATE (PF) 2 MG/ML IV SOLN: 2.0000 mg | INTRAVENOUS | Status: DC | PRN

## 2021-02-23 MED ORDER — SODIUM CHLORIDE 0.9 % IV BOLUS
1000.0000 mL | Freq: Once | INTRAVENOUS | Status: AC
  Administered 2021-02-23: 1000 mL via INTRAVENOUS

## 2021-02-23 MED ORDER — PAROXETINE HCL 20 MG PO TABS
20.0000 mg | ORAL_TABLET | Freq: Every day | ORAL | Status: DC
  Administered 2021-02-24: 20 mg via ORAL
  Filled 2021-02-23: qty 1

## 2021-02-23 MED ORDER — LORAZEPAM 2 MG/ML IJ SOLN: 0.5000 mg | Freq: Four times a day (QID) | INTRAMUSCULAR | Status: DC | PRN

## 2021-02-23 NOTE — ED Provider Notes (Signed)
[facility] EMERGENCY DEPARTMENT Provider Note   CSN: [id number] Arrival date & time: 02/23/21  1905     History Chief Complaint  Patient presents with  . Abdominal Pain    Peter Barajas is a 64 y.o. male with pertinent past medical history of anxiety and depression that presents to the emerge department today for abdominal pain.  Patient states that he started having abdominal pain last night, started in his umbilical area and then radiated to his right lower quadrant.  Patient states that pain is currently in his right lower quadrant.  Denies any nausea, vomiting or fevers.  Denies any diarrhea.  Denies any prior abdominal surgeries.  Went to his PCP who obtained a CT scan which shows acute appendicitis, no abscess or perforation.  Patient was told to come to the ED.  Patient has been vaccinated against COVID.  Currently complaining of right lower quadrant pain, no nausea or vomiting.  States that he has not eaten since 7 PM the day before.  Is not any blood thinners.  No other complaints at this time.  HPI     Past Medical History:  Diagnosis Date  . Anxiety   . Depression     There are no problems to display for this patient.   Past Surgical History:  Procedure Laterality Date  . COLONOSCOPY N/A 01/22/2015   Procedure: COLONOSCOPY;  Surgeon: Malissa Hippo, MD;  Location: [facility];  Service: Endoscopy;  Laterality: N/A;  730  . HERNIA REPAIR     at age 74       Family History  Problem Relation Age of Onset  . Colon cancer Other     Social History   Tobacco Use  . Smoking status: Never Smoker  . Smokeless tobacco: Never Used  Substance Use Topics  . Alcohol use: Yes    Alcohol/week: 2.0 - 3.0 standard drinks    Types: 2 - 3 Cans of beer per week    Comment: daily  . Drug use: No    Home Medications Prior to Admission medications   Medication Sig Start Date End Date Taking? Authorizing Provider  clonazePAM (KLONOPIN) 1 MG tablet Take 1 mg by mouth 4  (four) times daily.    [provider]  PARoxetine (PAXIL) 20 MG tablet Take 20 mg by mouth daily.    [provider]    Allergies    Patient has no known allergies.  Review of Systems   Review of Systems  Constitutional: Negative for chills, diaphoresis, fatigue and fever.  HENT: Negative for congestion, sore throat and trouble swallowing.   Eyes: Negative for pain and visual disturbance.  Respiratory: Negative for cough, shortness of breath and wheezing.   Cardiovascular: Negative for chest pain, palpitations and leg swelling.  Gastrointestinal: Positive for abdominal pain. Negative for abdominal distention, diarrhea, nausea and vomiting.  Genitourinary: Negative for difficulty urinating.  Musculoskeletal: Negative for back pain, neck pain and neck stiffness.  Skin: Negative for pallor.  Neurological: Negative for dizziness, speech difficulty, weakness and headaches.  Psychiatric/Behavioral: Negative for confusion.    Physical Exam Updated Vital Signs BP (!) 143/92 (BP Location: Right Arm)   Pulse (!) 125   Temp 97.9 F (36.6 C) (Oral)   Resp 20   Ht 5\' 7"  (1.702 m)   Wt 83 kg   SpO2 100%   BMI 28.66 kg/m   Physical Exam Constitutional:      General: He is not in acute distress.  Appearance: Normal appearance. He is not ill-appearing, toxic-appearing or diaphoretic.  HENT:     Mouth/Throat:     Mouth: Mucous membranes are moist.     Pharynx: Oropharynx is clear.  Eyes:     General: No scleral icterus.    Extraocular Movements: Extraocular movements intact.     Pupils: Pupils are equal, round, and reactive to light.  Cardiovascular:     Rate and Rhythm: Regular rhythm. Tachycardia present.     Pulses: Normal pulses.     Heart sounds: Normal heart sounds.  Pulmonary:     Effort: Pulmonary effort is normal. No respiratory distress.     Breath sounds: Normal breath sounds. No stridor. No wheezing, rhonchi or rales.  Chest:     Chest wall: No  tenderness.  Abdominal:     General: Abdomen is flat. There is no distension.     Palpations: Abdomen is soft.     Tenderness: There is abdominal tenderness in the right lower quadrant. There is guarding. There is no rebound. Positive signs include McBurney's sign.    Musculoskeletal:        General: No swelling or tenderness. Normal range of motion.     Cervical back: Normal range of motion and neck supple. No rigidity.     Right lower leg: No edema.     Left lower leg: No edema.  Skin:    General: Skin is warm and dry.     Capillary Refill: Capillary refill takes less than 2 seconds.     Coloration: Skin is not pale.  Neurological:     General: No focal deficit present.     Mental Status: He is alert and oriented to person, place, and time.  Psychiatric:        Mood and Affect: Mood normal.        Behavior: Behavior normal.     ED Results / Procedures / Treatments   Labs (all labs ordered are listed, but only abnormal results are displayed) Labs Reviewed  CBC WITH DIFFERENTIAL/PLATELET - Abnormal; Notable for the following components:      Result Value   RBC 4.20 (*)    All other components within normal limits  RESP PANEL BY RT-PCR (FLU A&B, COVID) ARPGX2  COMPREHENSIVE METABOLIC PANEL    EKG None  Radiology CT ABDOMEN PELVIS W CONTRAST  Result Date: 02/23/2021 CLINICAL DATA:  Right lower quadrant pain EXAM: CT ABDOMEN AND PELVIS WITH CONTRAST TECHNIQUE: Multidetector CT imaging of the abdomen and pelvis was performed using the standard protocol following bolus administration of intravenous contrast. CONTRAST:  OMNIPAQUE IOHEXOL 300 MG/ML  SOLN COMPARISON:  None. FINDINGS: Lower chest: The lung bases are clear. The heart size is normal. Hepatobiliary: The liver is normal. Normal gallbladder.There is no biliary ductal dilation. Pancreas: Normal contours without ductal dilatation. No peripancreatic fluid collection. Spleen: Unremarkable. Adrenals/Urinary Tract:  --Adrenal glands: Unremarkable. --Right kidney/ureter: No hydronephrosis or radiopaque kidney stones. --Left kidney/ureter: No hydronephrosis or radiopaque kidney stones. --Urinary bladder: Unremarkable. Stomach/Bowel: --Stomach/Duodenum: No hiatal hernia or other gastric abnormality. Normal duodenal course and caliber. --Small bowel: Unremarkable. --Colon: Rectosigmoid diverticulosis without acute inflammation. --Appendix: There is diffuse circumferential wall thickening of the appendix with the appendix measuring up to approximately 1.4 cm in with. There are periappendiceal inflammatory changes without evidence for free air or a drainable fluid collection. Vascular/Lymphatic: Atherosclerotic calcification is present within the non-aneurysmal abdominal aorta, without hemodynamically significant stenosis. --No retroperitoneal lymphadenopathy. --No mesenteric lymphadenopathy. --No pelvic or inguinal lymphadenopathy.  Reproductive: Unremarkable Other: No ascites or free air. There is a fat containing left inguinal hernia. Musculoskeletal. No acute displaced fractures. IMPRESSION: Acute uncomplicated appendicitis. Electronically Signed   By: Katherine Mantle M.D.   On: 02/23/2021 18:19    Procedures Procedures   Medications Ordered in ED Medications  cefTRIAXone (ROCEPHIN) 2 g in sodium chloride 0.9 % 100 mL IVPB (2 g Intravenous New Bag/Given 02/23/21 1959)    And  metroNIDAZOLE (FLAGYL) IVPB 500 mg (has no administration in time range)  HYDROmorphone (DILAUDID) injection 1 mg (1 mg Intravenous Given 02/23/21 2000)  sodium chloride 0.9 % bolus 1,000 mL (1,000 mLs Intravenous New Bag/Given 02/23/21 1957)    ED Course  I have reviewed the triage vital signs and the nursing notes.  Pertinent labs & imaging results that were available during my care of the patient were reviewed by me and considered in my medical decision making (see chart for details).    MDM Rules/Calculators/A&P                          Peter Barajas is a 65 y.o. male with pertinent past medical history of anxiety and depression that presents to the emerge department today for abdominal pain.  CT scan does show acute appendicitis, no fluid collection or perforation.  Patient appears well, however slightly tachycardic and does have right lower quadrant pain.  Will give fluids, antibiotics and Dilaudid at this time and consult general surgery.  743 spoke to Dr. Henreitta Leber, who will consult.  Recommends hospitalist admission and then will offer in the morning.  835 Dr. Carren Rang will admit.   The patient appears reasonably stabilized for admission considering the current resources, flow, and capabilities available in the ED at this time, and I doubt any other [facility] requiring further screening and/or treatment in the ED prior to admission.  Final Clinical Impression(s) / ED Diagnoses Final diagnoses:  Acute appendicitis with localized peritonitis, without perforation, abscess, or gangrene    Rx / DC Orders ED Discharge Orders    None       Farrel Gordon, PA-C 02/23/21 2038    Maia Plan, MD 02/24/21 1053

## 2021-02-23 NOTE — Progress Notes (Signed)
[facility]  Acute appendicitis on CT. PA reports no major medical issues. Will plan for surgery in AM. IV antibiotics now.   Will discuss with patient in AM and surgery at 730.   NPO   Algis Greenhouse, MD [facility] [address] [place], [place] [address] [phone] (office)

## 2021-02-23 NOTE — ED Triage Notes (Signed)
Pt c/o right lower abd pain upon palpitation since 2000 last night. Pt denies any n/v/d.

## 2021-02-24 ENCOUNTER — Encounter (HOSPITAL_COMMUNITY)
Admission: EM | Disposition: A | Discharge status: Home / Self Care | Payer: Self-pay | Source: Home / Self Care | Attending: Family Medicine

## 2021-02-24 ENCOUNTER — Inpatient Hospital Stay (HOSPITAL_COMMUNITY): Payer: Self-pay | Admitting: Anesthesiology

## 2021-02-24 ENCOUNTER — Encounter (HOSPITAL_COMMUNITY): Payer: Self-pay | Admitting: Family Medicine

## 2021-02-24 DIAGNOSIS — K353 Acute appendicitis with localized peritonitis, without perforation or gangrene: Principal | ICD-10-CM

## 2021-02-24 DIAGNOSIS — F419 Anxiety disorder, unspecified: Secondary | ICD-10-CM | POA: Diagnosis present

## 2021-02-24 DIAGNOSIS — F32A Depression, unspecified: Secondary | ICD-10-CM

## 2021-02-24 DIAGNOSIS — E871 Hypo-osmolality and hyponatremia: Secondary | ICD-10-CM

## 2021-02-24 HISTORY — PX: LAPAROSCOPIC APPENDECTOMY: SHX408

## 2021-02-24 LAB — PROTIME-INR
INR: 1 (ref 0.8–1.2)
Prothrombin Time: 12.7 seconds (ref 11.4–15.2)

## 2021-02-24 LAB — COMPREHENSIVE METABOLIC PANEL
ALT: 21 U/L (ref 0–44)
AST: 27 U/L (ref 15–41)
Albumin: 4.4 g/dL (ref 3.5–5.0)
Alkaline Phosphatase: 100 U/L (ref 38–126)
Anion gap: 12 (ref 5–15)
BUN: 10 mg/dL (ref 8–23)
CO2: 25 mmol/L (ref 22–32)
Calcium: 8.5 mg/dL — ABNORMAL LOW (ref 8.9–10.3)
Chloride: 99 mmol/L (ref 98–111)
Creatinine, Ser: 0.98 mg/dL (ref 0.61–1.24)
GFR, Estimated: >60 mL/min (ref >60)
Glucose, Bld: 99 mg/dL (ref 70–99)
Potassium: 3.1 mmol/L — ABNORMAL LOW (ref 3.5–5.1)
Sodium: 136 mmol/L (ref 135–145)
Total Bilirubin: 0.9 mg/dL (ref 0.3–1.2)
Total Protein: 7.8 g/dL (ref 6.5–8.1)

## 2021-02-24 LAB — CBC WITH DIFFERENTIAL/PLATELET
Abs Immature Granulocytes: 0.02 10*3/uL (ref 0.00–0.07)
Basophils Absolute: 0.1 10*3/uL (ref 0.0–0.1)
Basophils Relative: 1 %
Eosinophils Absolute: 0.1 10*3/uL (ref 0.0–0.5)
Eosinophils Relative: 2 %
HCT: 47.3 % (ref 39.0–52.0)
Hemoglobin: 15.1 g/dL (ref 13.0–17.0)
Immature Granulocytes: 0 %
Lymphocytes Relative: 29 %
Lymphs Abs: 2.1 10*3/uL (ref 0.7–4.0)
MCH: 30.7 pg (ref 26.0–34.0)
MCHC: 31.9 g/dL (ref 30.0–36.0)
MCV: 96.1 fL (ref 80.0–100.0)
Monocytes Absolute: 0.6 10*3/uL (ref 0.1–1.0)
Monocytes Relative: 8 %
Neutro Abs: 4.3 10*3/uL (ref 1.7–7.7)
Neutrophils Relative %: 60 %
Platelets: 260 10*3/uL (ref 150–400)
RBC: 4.92 MIL/uL (ref 4.22–5.81)
RDW: 13.1 % (ref 11.5–15.5)
WBC: 7.3 10*3/uL (ref 4.0–10.5)
nRBC: 0 % (ref 0.0–0.2)

## 2021-02-24 LAB — URINALYSIS, ROUTINE W REFLEX MICROSCOPIC
Bacteria, UA: NONE SEEN
Bilirubin Urine: NEGATIVE
Glucose, UA: NEGATIVE mg/dL
Ketones, ur: NEGATIVE mg/dL
Leukocytes,Ua: NEGATIVE
Nitrite: NEGATIVE
Protein, ur: NEGATIVE mg/dL
Specific Gravity, Urine: 1.033 — ABNORMAL HIGH (ref 1.005–1.030)
pH: 5 (ref 5.0–8.0)

## 2021-02-24 LAB — SURGICAL PCR SCREEN
MRSA, PCR: NEGATIVE
Staphylococcus aureus: NEGATIVE

## 2021-02-24 LAB — MAGNESIUM: Magnesium: 2.1 mg/dL (ref 1.7–2.4)

## 2021-02-24 LAB — HIV ANTIBODY (ROUTINE TESTING W REFLEX): HIV Screen 4th Generation wRfx: NONREACTIVE

## 2021-02-24 SURGERY — APPENDECTOMY, LAPAROSCOPIC
Anesthesia: General | Site: Abdomen

## 2021-02-24 MED ORDER — HYDROMORPHONE HCL 1 MG/ML IJ SOLN
0.2500 mg | INTRAMUSCULAR | Status: DC | PRN
  Filled 2021-02-24: qty 0.5

## 2021-02-24 MED ORDER — FENTANYL CITRATE (PF) 100 MCG/2ML IJ SOLN
INTRAMUSCULAR | Status: AC
  Filled 2021-02-24: qty 2

## 2021-02-24 MED ORDER — MEPERIDINE HCL 50 MG/ML IJ SOLN: 6.2500 mg | INTRAMUSCULAR | Status: DC | PRN

## 2021-02-24 MED ORDER — ROCURONIUM BROMIDE 100 MG/10ML IV SOLN
INTRAVENOUS | Status: DC | PRN
  Administered 2021-02-24: 40 mg via INTRAVENOUS

## 2021-02-24 MED ORDER — ONDANSETRON HCL 4 MG PO TABS
4.0000 mg | ORAL_TABLET | Freq: Four times a day (QID) | ORAL | 0 refills | Status: AC | PRN
Start: 2021-02-24 — End: ?

## 2021-02-24 MED ORDER — ONDANSETRON HCL 4 MG/2ML IJ SOLN
INTRAMUSCULAR | Status: AC
  Filled 2021-02-24: qty 2

## 2021-02-24 MED ORDER — CHLORHEXIDINE GLUCONATE CLOTH 2 % EX PADS: 6.0000 | MEDICATED_PAD | Freq: Once | CUTANEOUS | Status: AC

## 2021-02-24 MED ORDER — BUPIVACAINE LIPOSOME 1.3 % IJ SUSP
INTRAMUSCULAR | Status: AC
  Filled 2021-02-24: qty 20

## 2021-02-24 MED ORDER — CHLORHEXIDINE GLUCONATE CLOTH 2 % EX PADS
6.0000 | MEDICATED_PAD | Freq: Once | CUTANEOUS | Status: AC
  Administered 2021-02-24: 6 via TOPICAL

## 2021-02-24 MED ORDER — EPHEDRINE SULFATE 50 MG/ML IJ SOLN
INTRAMUSCULAR | Status: DC | PRN
  Administered 2021-02-24: 10 mg via INTRAVENOUS

## 2021-02-24 MED ORDER — SODIUM CHLORIDE 0.9 % IR SOLN
Status: DC | PRN
  Administered 2021-02-24: 1000 mL

## 2021-02-24 MED ORDER — FENTANYL CITRATE (PF) 100 MCG/2ML IJ SOLN
INTRAMUSCULAR | Status: DC | PRN
  Administered 2021-02-24 (×4): 50 ug via INTRAVENOUS

## 2021-02-24 MED ORDER — SUCCINYLCHOLINE CHLORIDE 200 MG/10ML IV SOSY
PREFILLED_SYRINGE | INTRAVENOUS | Status: AC
  Filled 2021-02-24: qty 10

## 2021-02-24 MED ORDER — DEXAMETHASONE SODIUM PHOSPHATE 10 MG/ML IJ SOLN
INTRAMUSCULAR | Status: DC | PRN
  Administered 2021-02-24: 10 mg via INTRAVENOUS

## 2021-02-24 MED ORDER — SUCCINYLCHOLINE CHLORIDE 20 MG/ML IJ SOLN
INTRAMUSCULAR | Status: DC | PRN
  Administered 2021-02-24: 100 mg via INTRAVENOUS

## 2021-02-24 MED ORDER — OXYCODONE HCL 5 MG PO TABS: 5.0000 mg | ORAL_TABLET | ORAL | 0 refills | Status: AC | PRN

## 2021-02-24 MED ORDER — SUGAMMADEX SODIUM 500 MG/5ML IV SOLN
INTRAVENOUS | Status: DC | PRN
  Administered 2021-02-24: 200 mg via INTRAVENOUS

## 2021-02-24 MED ORDER — CHLORHEXIDINE GLUCONATE 0.12 % MT SOLN
15.0000 mL | Freq: Once | OROMUCOSAL | Status: AC
  Administered 2021-02-24: 15 mL via OROMUCOSAL

## 2021-02-24 MED ORDER — DEXAMETHASONE SODIUM PHOSPHATE 10 MG/ML IJ SOLN
INTRAMUSCULAR | Status: AC
  Filled 2021-02-24: qty 1

## 2021-02-24 MED ORDER — LIDOCAINE HCL (CARDIAC) PF 50 MG/5ML IV SOSY
PREFILLED_SYRINGE | INTRAVENOUS | Status: DC | PRN
  Administered 2021-02-24: 50 mg via INTRAVENOUS

## 2021-02-24 MED ORDER — PROPOFOL 10 MG/ML IV BOLUS
INTRAVENOUS | Status: AC
  Filled 2021-02-24: qty 40

## 2021-02-24 MED ORDER — SODIUM CHLORIDE 0.9 % IV SOLN
2.0000 g | INTRAVENOUS | Status: AC
  Administered 2021-02-24: 2 g via INTRAVENOUS
  Filled 2021-02-24 (×2): qty 2

## 2021-02-24 MED ORDER — ONDANSETRON HCL 4 MG/2ML IJ SOLN
4.0000 mg | Freq: Once | INTRAMUSCULAR | Status: AC | PRN
  Administered 2021-02-24: 4 mg via INTRAVENOUS
  Filled 2021-02-24: qty 2

## 2021-02-24 MED ORDER — BUPIVACAINE LIPOSOME 1.3 % IJ SUSP
INTRAMUSCULAR | Status: DC | PRN
  Administered 2021-02-24: 20 mL

## 2021-02-24 MED ORDER — LACTATED RINGERS IV SOLN: INTRAVENOUS | Status: DC

## 2021-02-24 MED ORDER — LIDOCAINE HCL (PF) 2 % IJ SOLN
INTRAMUSCULAR | Status: AC
  Filled 2021-02-24: qty 5

## 2021-02-24 MED ORDER — ROCURONIUM BROMIDE 10 MG/ML (PF) SYRINGE
PREFILLED_SYRINGE | INTRAVENOUS | Status: AC
  Filled 2021-02-24: qty 10

## 2021-02-24 MED ORDER — PROPOFOL 10 MG/ML IV BOLUS
INTRAVENOUS | Status: DC | PRN
  Administered 2021-02-24: 150 mg via INTRAVENOUS
  Administered 2021-02-24: 50 mg via INTRAVENOUS

## 2021-02-24 MED ORDER — ORAL CARE MOUTH RINSE: 15.0000 mL | Freq: Once | OROMUCOSAL | Status: AC

## 2021-02-24 SURGICAL SUPPLY — 43 items
BAG RETRIEVAL 10 (BASKET) ×1
BLADE SURG 15 STRL LF DISP TIS (BLADE) ×1 IMPLANT
BLADE SURG 15 STRL SS (BLADE) ×2
CHLORAPREP W/TINT 26 (MISCELLANEOUS) ×2 IMPLANT
CLOTH BEACON ORANGE TIMEOUT ST (SAFETY) ×2 IMPLANT
COVER LIGHT HANDLE STERIS (MISCELLANEOUS) ×4 IMPLANT
COVER WAND RF STERILE (DRAPES) ×2 IMPLANT
CUTTER FLEX LINEAR 45M (STAPLE) ×2 IMPLANT
DERMABOND ADVANCED (GAUZE/BANDAGES/DRESSINGS) ×1
DERMABOND ADVANCED .7 DNX12 (GAUZE/BANDAGES/DRESSINGS) ×1 IMPLANT
ELECT REM PT RETURN 9FT ADLT (ELECTROSURGICAL) ×2
ELECTRODE REM PT RTRN 9FT ADLT (ELECTROSURGICAL) ×1 IMPLANT
GLOVE SURG ENC MOIS LTX SZ6.5 (GLOVE) ×2 IMPLANT
GLOVE SURG UNDER POLY LF SZ6.5 (GLOVE) ×2 IMPLANT
GLOVE SURG UNDER POLY LF SZ7 (GLOVE) ×6 IMPLANT
GOWN STRL REUS W/TWL LRG LVL3 (GOWN DISPOSABLE) ×4 IMPLANT
INST SET LAPROSCOPIC AP (KITS) ×2 IMPLANT
KIT TURNOVER KIT A (KITS) ×2 IMPLANT
MANIFOLD NEPTUNE II (INSTRUMENTS) ×2 IMPLANT
NEEDLE HYPO 18GX1.5 BLUNT FILL (NEEDLE) ×2 IMPLANT
NEEDLE HYPO 21X1.5 SAFETY (NEEDLE) ×2 IMPLANT
NEEDLE INSUFFLATION 14GA 120MM (NEEDLE) ×2 IMPLANT
NS IRRIG 1000ML POUR BTL (IV SOLUTION) ×2 IMPLANT
PACK LAP CHOLE LZT030E (CUSTOM PROCEDURE TRAY) ×2 IMPLANT
PAD ARMBOARD 7.5X6 YLW CONV (MISCELLANEOUS) ×2 IMPLANT
RELOAD 45 VASCULAR/THIN (ENDOMECHANICALS) IMPLANT
RELOAD STAPLE TA45 3.5 REG BLU (ENDOMECHANICALS) ×2 IMPLANT
SET BASIN LINEN APH (SET/KITS/TRAYS/PACK) ×2 IMPLANT
SET TUBE IRRIG SUCTION NO TIP (IRRIGATION / IRRIGATOR) IMPLANT
SET TUBE SMOKE EVAC HIGH FLOW (TUBING) ×2 IMPLANT
SHEARS HARMONIC ACE PLUS 36CM (ENDOMECHANICALS) ×2 IMPLANT
SUT MNCRL AB 4-0 PS2 18 (SUTURE) ×4 IMPLANT
SUT VICRYL 0 UR6 27IN ABS (SUTURE) ×2 IMPLANT
SYR 20ML LL LF (SYRINGE) ×4 IMPLANT
SYS BAG RETRIEVAL 10MM (BASKET) ×1
SYSTEM BAG RETRIEVAL 10MM (BASKET) ×1 IMPLANT
TRAY FOLEY W/BAG SLVR 16FR (SET/KITS/TRAYS/PACK) ×2
TRAY FOLEY W/BAG SLVR 16FR ST (SET/KITS/TRAYS/PACK) ×1 IMPLANT
TROCAR ENDO BLADELESS 11MM (ENDOMECHANICALS) ×2 IMPLANT
TROCAR ENDO BLADELESS 12MM (ENDOMECHANICALS) ×2 IMPLANT
TROCAR XCEL NON-BLD 5MMX100MML (ENDOMECHANICALS) ×2 IMPLANT
TUBE CONNECTING 12X1/4 (SUCTIONS) ×2 IMPLANT
WARMER LAPAROSCOPE (MISCELLANEOUS) ×2 IMPLANT

## 2021-02-24 NOTE — Discharge Instructions (Signed)
Discharge Laparoscopic Surgery Instructions:  Common Complaints: Right shoulder pain is common after laparoscopic surgery. This is secondary to the gas used in the surgery being trapped under the diaphragm.  Walk to help your body absorb the gas. This will improve in a few days. Pain at the port sites are common, especially the larger port sites. This will improve with time.  Some nausea is common and poor appetite. The main goal is to stay hydrated the first few days after surgery.   Diet/ Activity: Diet as tolerated. You may not have an appetite, but it is important to stay hydrated. Drink 64 ounces of water a day. Your appetite will return with time.  Shower per your regular routine daily.  Do not take hot showers. Take warm showers that are less than 10 minutes. Rest and listen to your body, but do not remain in bed all day.  Walk everyday for at least 15-20 minutes. Deep cough and move around every 1-2 hours in the first few days after surgery.  Do not lift > 10 lbs, perform excessive bending, pushing, pulling, squatting for 1-2 weeks after surgery.  Do not pick at the dermabond glue on your incision sites.  This glue film will remain in place for 1-2 weeks and will start to peel off.  Do not place lotions or balms on your incision unless instructed to specifically by Dr. Mate Alegria.   Pain Expectations and Narcotics: -After surgery you will have pain associated with your incisions and this is normal. The pain is muscular and nerve pain, and will get better with time. -You are encouraged and expected to take non narcotic medications like tylenol and ibuprofen (when able) to treat pain as multiple modalities can aid with pain treatment. -Narcotics are only used when pain is severe or there is breakthrough pain. -You are not expected to have a pain score of 0 after surgery, as we cannot prevent pain. A pain score of 3-4 that allows you to be functional, move, walk, and tolerate some activity is  the goal. The pain will continue to improve over the days after surgery and is dependent on your surgery. -Due to [place] law, we are only able to give a certain amount of pain medication to treat post operative pain, and we only give additional narcotics on a patient by patient basis.  -For most laparoscopic surgery, studies have shown that the majority of patients only need 10-15 narcotic pills, and for open surgeries most patients only need 15-20.   -Having appropriate expectations of pain and knowledge of pain management with non narcotics is important as we do not want anyone to become addicted to narcotic pain medication.  -Using ice packs in the first 48 hours and heating pads after 48 hours, wearing an abdominal binder (when recommended), and using over the counter medications are all ways to help with pain management.   -Simple acts like meditation and mindfulness practices after surgery can also help with pain control and research has proven the benefit of these practices.  Medication: Take tylenol and ibuprofen as needed for pain control, alternating every 4-6 hours.  Example:  Tylenol 1000mg @ 6am, 12noon, 6pm, 12midnight (Do not exceed 4000mg of tylenol a day). Ibuprofen 800mg @ 9am, 3pm, 9pm, 3am (Do not exceed 3600mg of ibuprofen a day).  Take Roxicodone for breakthrough pain every 4 hours.  Take Colace for constipation related to narcotic pain medication. If you do not have a bowel movement in 2 days, take Miralax   over the counter.  Drink plenty of water to also prevent constipation.   Contact Information: If you have questions or concerns, please call our office, [phone], Monday- Thursday 8AM-5PM and Friday 8AM-12Noon.  If it is after hours or on the weekend, please call [facility] Main [facility], [phone], and ask to speak to the surgeon on call for Dr. Henreitta Leber at [facility].     Laparoscopic Appendectomy, Adult, Care After This sheet gives you information about how to care for  yourself after your procedure. Your health care provider may also give you more specific instructions. If you have problems or questions, contact your health care provider. What can I expect after the procedure? After the procedure, it is common to have:  Little energy for normal activities.  Mild pain in the area where the incisions were made.  Difficulty passing stool (constipation). This can be caused by: ? Pain medicine. ? A decrease in your activity. Follow these instructions at home: Medicines  Take over-the-counter and prescription medicines only as told by your health care provider.  If you were prescribed an antibiotic medicine, take it as told by your health care provider. Do not stop taking the antibiotic even if you start to feel better.  Do not drive or use heavy machinery while taking prescription pain medicine.  Ask your health care provider if the medicine prescribed to you can cause constipation. You may need to take steps to prevent or treat constipation, such as: ? Drink enough fluid to keep your urine pale yellow. ? Take over-the-counter or prescription medicines. ? Eat foods that are high in fiber, such as beans, whole grains, and fresh fruits and vegetables. ? Limit foods that are high in fat and processed sugars, such as fried or sweet foods. Incision care  Follow instructions from your health care provider about how to take care of your incisions. Make sure you: ? Wash your hands with soap and water before and after you change your bandage (dressing). If soap and water are not available, use hand sanitizer. ? Change your dressing as told by your health care provider. ? Leave stitches (sutures), skin glue, or adhesive strips in place. These skin closures may need to stay in place for 2 weeks or longer. If adhesive strip edges start to loosen and curl up, you may trim the loose edges. Do not remove adhesive strips completely unless your health care provider tells  you to do that.  Check your incision areas every day for signs of infection. Check for: ? Redness, swelling, or pain. ? Fluid or blood. ? Warmth. ? Pus or a bad smell.   Bathing  Keep your incisions clean and dry. Clean them as often as told by your health care provider. To do this: 1. Gently wash the incisions with soap and water. 2. Rinse the incisions with water to remove all soap. 3. Pat the incisions dry with a clean towel. Do not rub the incisions.  Do not take baths, swim, or use a hot tub for 2 weeks, or until your health care provider approves. You may shower.  Activity  Do not drive for 24 hours if you were given a sedative during your procedure.  Rest after the procedure. Return to your normal activities as told by your health care provider. Ask your health care provider what activities are safe for you.  For 1-2 weeks, or for as long as told by your health care provider: ? Do not lift anything that is heavier than  10 lb (4.5 kg), or the limit that you are told. ? Do not play contact sports.   General instructions  If you were sent home with a drain, follow instructions from your health care provider about how to care for it.  Take deep breaths. This helps to prevent your lungs from developing an infection (pneumonia).  Keep all follow-up visits as told by your health care provider. This is important. Contact a health care provider if:  You have redness, swelling, or pain around an incision.  You have fluid or blood coming from an incision.  Your incision feels warm to the touch.  You have pus or a bad smell coming from an incision or dressing.  Your incision edges break open after your sutures have been removed.  You have increasing pain in your shoulders.  You feel dizzy or you faint.  You develop shortness of breath.  You keep feeling nauseous or you are vomiting.  You have diarrhea or you cannot control your bowel functions.  You lose your  appetite.  You develop swelling or pain in your legs.  You develop a rash. Get help right away if you have:  A fever.  Difficulty breathing.  Sharp pains in your chest. Summary  After a laparoscopic appendectomy, it is common to have little energy for normal activities, mild pain in the area of the incisions, and constipation.  Infection is the most common complication after this procedure. Follow your health care provider's instructions about caring for yourself after the procedure.  Rest after the procedure. Return to your normal activities as told by your health care provider.  Contact your health care provider if you notice signs of infection around your incisions or you develop shortness of breath. Get help right away if you have a fever, chest pain, or difficulty breathing. This information is not intended to replace advice given to you by your health care provider. Make sure you discuss any questions you have with your health care provider. Document Revised: 04/13/2018 Document Reviewed: 04/13/2018 Elsevier Patient Education  2021 [employer].

## 2021-02-24 NOTE — Progress Notes (Signed)
[facility]  Updated son about surgery being completed. Possible home later today.   Algis Greenhouse, MD [facility] [address] [place], [place] [address] [phone] (office)

## 2021-02-24 NOTE — Progress Notes (Signed)
Pt back to room from PACU. A&O, a little drowsy. VSS. Skin glue dry and intact to abd port sites x3. Abd tender to palpation. Pt requesting oral fluids and pain pill. SCD's on, moves all extremities without difficulty. Lungs clear, bowel sounds present but hypoactive.

## 2021-02-24 NOTE — Anesthesia Postprocedure Evaluation (Signed)
Anesthesia Post Note  Patient: Peter Barajas  Procedure(s) Performed: APPENDECTOMY LAPAROSCOPIC (N/A Abdomen)  Patient location during evaluation: PACU Anesthesia Type: General Level of consciousness: awake and alert and oriented Pain management: pain level controlled Vital Signs Assessment: post-procedure vital signs reviewed and stable Respiratory status: spontaneous breathing and respiratory function stable Cardiovascular status: blood pressure returned to baseline and stable Postop Assessment: no apparent nausea or vomiting Anesthetic complications: no   No complications documented.   Last Vitals:  Vitals:   02/24/21 0900 02/24/21 0922  BP: 133/86 128/76  Pulse: 64 69  Resp: 14 16  Temp: 36.9 C 36.5 C  SpO2: 100% 99%    Last Pain:  Vitals:   02/24/21 0922  TempSrc: Oral  PainSc: 3                  Kirstan Fentress C Aaisha Sliter

## 2021-02-24 NOTE — Progress Notes (Signed)
Pt ambulated in hallway with only standby assistance. Tolerated well. Pt has eaten soft regular diet with no c/o n/v, tolerating oral fluids without issue. Pain relieved with oral pain medication. Pt has voided without difficulty. Pt is asking about discharge. MD Henreitta Leber notified of current pt status and request to be discharged.

## 2021-02-24 NOTE — Anesthesia Procedure Notes (Signed)
Procedure Name: Intubation Date/Time: 02/24/2021 7:36 AM Performed by: Vista Deck, CRNA Pre-anesthesia Checklist: Patient identified, Patient being monitored, Timeout performed, Emergency Drugs available and Suction available Patient Re-evaluated:Patient Re-evaluated prior to induction Oxygen Delivery Method: Circle System Utilized Preoxygenation: Pre-oxygenation with 100% oxygen Induction Type: IV induction Laryngoscope Size: Mac and 3 Grade View: Grade II Tube type: Oral Tube size: 7.5 mm Number of attempts: 1 Airway Equipment and Method: stylet Placement Confirmation: ETT inserted through vocal cords under direct vision,  positive ETCO2 and breath sounds checked- equal and bilateral Secured at: 21 cm Tube secured with: Tape Dental Injury: Teeth and Oropharynx as per pre-operative assessment

## 2021-02-24 NOTE — Consult Note (Signed)
[facility] Consult  Reason for Consult: Acute appendicitis  Referring Physician:  Dr. Carren Rang  Chief Complaint    Abdominal Pain      HPI: Peter Barajas is a 65 y.o. male with acute appendicitis on CT exam.  He has a history of anxiety and depression and has had pain for about 1 day before he presented.  He has a history of some diverticulitis but this pain was more severe. The pain has moved to his RLQ from his central abdomen like a belt.  He denied any nausea or vomiting. He drinks 2 beers a day. He has been vaccinated for COVID.    Past Medical History:  Diagnosis Date  . Anxiety   . Depression     Past Surgical History:  Procedure Laterality Date  . COLONOSCOPY N/A 01/22/2015   Procedure: COLONOSCOPY;  Surgeon: Malissa Hippo, MD;  Location: [facility];  Service: Endoscopy;  Laterality: N/A;  730  . HERNIA REPAIR     at age 63    Family History  Problem Relation Age of Onset  . Colon cancer Other     Social History   Tobacco Use  . Smoking status: Never Smoker  . Smokeless tobacco: Never Used  Substance Use Topics  . Alcohol use: Yes    Alcohol/week: 2.0 - 3.0 standard drinks    Types: 2 - 3 Cans of beer per week    Comment: daily  . Drug use: No    Medications:  I have reviewed the patient's current medications. Prior to Admission:  Medications Prior to Admission  Medication Sig Dispense Refill Last Dose  . clonazePAM (KLONOPIN) 1 MG tablet Take 1 mg by mouth 4 (four) times daily.     Marland Kitchen PARoxetine (PAXIL) 20 MG tablet Take 20 mg by mouth daily.      Scheduled: . [MAR Hold] clonazePAM  1 mg Oral QID  . [MAR Hold] heparin  5,000 Units Subcutaneous Q8H  . [MAR Hold] PARoxetine  20 mg Oral Daily   Continuous: . sodium chloride 100 mL/hr at 02/23/21 2237  . cefoTEtan (CEFOTAN) IV    . [MAR Hold] cefTRIAXone (ROCEPHIN)  IV    . [MAR Hold] metronidazole 500 mg (02/24/21 0505)   PRN:[MAR Hold] acetaminophen **OR** [MAR Hold]  acetaminophen, [MAR Hold] LORazepam, [MAR Hold]  morphine injection, [MAR Hold] ondansetron **OR** [MAR Hold] ondansetron (ZOFRAN) IV, [MAR Hold] oxyCODONE  No Known Allergies   ROS:  A comprehensive review of systems was negative except for: Gastrointestinal: positive for abdominal pain  Blood pressure 123/76, pulse 66, temperature 97.7 F (36.5 C), temperature source Oral, resp. rate 18, height 5\' 7"  (1.702 m), weight 83 kg, SpO2 98 %. Physical Exam Vitals reviewed.  Constitutional:      Appearance: He is well-developed.  HENT:     Head: Normocephalic.  Cardiovascular:     Rate and Rhythm: Normal rate and regular rhythm.  Pulmonary:     Effort: Pulmonary effort is normal.     Breath sounds: Normal breath sounds.  Abdominal:     General: There is distension.     Palpations: Abdomen is soft.     Tenderness: There is abdominal tenderness in the right lower quadrant. Positive signs include McBurney's sign.  Musculoskeletal:     Comments: moves all extremities   Skin:    General: Skin is warm.  Neurological:     General: No focal deficit present.     Mental Status: He is  alert and oriented to person, place, and time.  Psychiatric:        Mood and Affect: Mood normal.        Behavior: Behavior normal.     Results: Results for orders placed or performed during the hospital encounter of 02/23/21 (from the past 48 hour(s))  Comprehensive metabolic panel     Status: Abnormal   Collection Time: 02/23/21  7:51 PM  Result Value Ref Range   Sodium 133 (L) 135 - 145 mmol/L   Potassium 4.2 3.5 - 5.1 mmol/L   Chloride 96 (L) 98 - 111 mmol/L   CO2 27 22 - 32 mmol/L   Glucose, Bld 95 70 - 99 mg/dL    Comment: Glucose reference range applies only to samples taken after fasting for at least 8 hours.   BUN 10 8 - 23 mg/dL   Creatinine, Ser 4.090.93 0.61 - 1.24 mg/dL   Calcium 9.6 8.9 - 81.110.3 mg/dL   Total Protein 7.9 6.5 - 8.1 g/dL   Albumin 4.6 3.5 - 5.0 g/dL   AST 21 15 - 41 U/L   ALT  22 0 - 44 U/L   Alkaline Phosphatase 102 38 - 126 U/L   Total Bilirubin 1.0 0.3 - 1.2 mg/dL   GFR, Estimated >91>60 >47>60 mL/min    Comment: (NOTE) Calculated using the CKD-EPI Creatinine Equation (2021)    Anion gap 10 5 - 15    Comment: Performed at [facility], [address] [place], [place] [address]  CBC with Differential     Status: Abnormal   Collection Time: 02/23/21  7:51 PM  Result Value Ref Range   WBC 7.8 4.0 - 10.5 K/uL   RBC 4.20 (L) 4.22 - 5.81 MIL/uL   Hemoglobin 13.2 13.0 - 17.0 g/dL   HCT 21.339.0 08.639.0 - 57.852.0 %   MCV 92.9 80.0 - 100.0 fL   MCH 31.4 26.0 - 34.0 pg   MCHC 33.8 30.0 - 36.0 g/dL   RDW 46.912.9 62.911.5 - 52.815.5 %   Platelets 200 150 - 400 K/uL   nRBC 0.0 0.0 - 0.2 %   Neutrophils Relative % 68 %   Neutro Abs 5.3 1.7 - 7.7 K/uL   Lymphocytes Relative 22 %   Lymphs Abs 1.7 0.7 - 4.0 K/uL   Monocytes Relative 8 %   Monocytes Absolute 0.6 0.1 - 1.0 K/uL   Eosinophils Relative 1 %   Eosinophils Absolute 0.1 0.0 - 0.5 K/uL   Basophils Relative 1 %   Basophils Absolute 0.0 0.0 - 0.1 K/uL   Immature Granulocytes 0 %   Abs Immature Granulocytes 0.03 0.00 - 0.07 K/uL    Comment: Performed at [facility], [address] [place], [place] [address]  Resp Panel by RT-PCR (Flu A&B, Covid) Nasopharyngeal Swab     Status: None   Collection Time: 02/23/21  7:51 PM   Specimen: Nasopharyngeal Swab; Nasopharyngeal(NP) swabs in vial transport medium  Result Value Ref Range   SARS Coronavirus 2 by RT PCR NEGATIVE NEGATIVE    Comment: (NOTE) SARS-CoV-2 target nucleic acids are NOT DETECTED.  The SARS-CoV-2 RNA is generally detectable in upper respiratory specimens during the acute phase of infection. The lowest concentration of SARS-CoV-2 viral copies this assay can detect is 138 copies/mL. A negative result does not preclude SARS-Cov-2 infection and should not be used as the sole basis for treatment or other patient management decisions. A negative result may occur with   improper specimen collection/handling, submission of  specimen other than nasopharyngeal swab, presence of viral mutation(s) within the areas targeted by this assay, and inadequate number of viral copies(<138 copies/mL). A negative result must be combined with clinical observations, patient history, and epidemiological information. The expected result is Negative.  Fact Sheet for Patients:  BloggerCourse.com  Fact Sheet for Healthcare Providers:  SeriousBroker.it  This test is no t yet approved or cleared by the [place] FDA and  has been authorized for detection and/or diagnosis of SARS-CoV-2 by FDA under an Emergency Use Authorization (EUA). This EUA will remain  in effect (meaning this test can be used) for the duration of the COVID-19 declaration under Section 564(b)(1) of the Act, 21 U.S.C.section 360bbb-3(b)(1), unless the authorization is terminated  or revoked sooner.       Influenza A by PCR NEGATIVE NEGATIVE   Influenza B by PCR NEGATIVE NEGATIVE    Comment: (NOTE) The Xpert Xpress SARS-CoV-2/FLU/RSV plus assay is intended as an aid in the diagnosis of influenza from Nasopharyngeal swab specimens and should not be used as a sole basis for treatment. Nasal washings and aspirates are unacceptable for Xpert Xpress SARS-CoV-2/FLU/RSV testing.  Fact Sheet for Patients: BloggerCourse.com  Fact Sheet for Healthcare Providers: SeriousBroker.it  This test is not yet approved or cleared by the [place] FDA and has been authorized for detection and/or diagnosis of SARS-CoV-2 by FDA under an Emergency Use Authorization (EUA). This EUA will remain in effect (meaning this test can be used) for the duration of the COVID-19 declaration under Section 564(b)(1) of the Act, 21 U.S.C. section 360bbb-3(b)(1), unless the authorization is terminated or revoked.  Performed at  [facility], [address] [place], [place] [address]   Urinalysis, Routine w reflex microscopic Urine, Random     Status: Abnormal   Collection Time: 02/24/21  1:08 AM  Result Value Ref Range   Color, Urine YELLOW YELLOW   APPearance CLEAR CLEAR   Specific Gravity, Urine 1.033 (H) 1.005 - 1.030   pH 5.0 5.0 - 8.0   Glucose, UA NEGATIVE NEGATIVE mg/dL   Hgb urine dipstick SMALL (A) NEGATIVE   Bilirubin Urine NEGATIVE NEGATIVE   Ketones, ur NEGATIVE NEGATIVE mg/dL   Protein, ur NEGATIVE NEGATIVE mg/dL   Nitrite NEGATIVE NEGATIVE   Leukocytes,Ua NEGATIVE NEGATIVE   WBC, UA 0-5 0 - 5 WBC/hpf   Bacteria, UA NONE SEEN NONE SEEN    Comment: Performed at [facility], [address] [place], [place] [address]  Surgical pcr screen     Status: None   Collection Time: 02/24/21  1:08 AM   Specimen: Nasal Mucosa; Nasal Swab  Result Value Ref Range   MRSA, PCR NEGATIVE NEGATIVE   Staphylococcus aureus NEGATIVE NEGATIVE    Comment: (NOTE) The Xpert SA Assay (FDA approved for NASAL specimens in patients 21 years of age and older), is one component of a comprehensive surveillance program. It is not intended to diagnose infection nor to guide or monitor treatment. Performed at [facility], [address] [place], [place] [address]   CBC WITH DIFFERENTIAL     Status: None   Collection Time: 02/24/21  5:35 AM  Result Value Ref Range   WBC 7.3 4.0 - 10.5 K/uL   RBC 4.92 4.22 - 5.81 MIL/uL   Hemoglobin 15.1 13.0 - 17.0 g/dL   HCT 14.4 81.8 - 56.3 %   MCV 96.1 80.0 - 100.0 fL   MCH 30.7 26.0 - 34.0 pg   MCHC 31.9 30.0 - 36.0 g/dL  RDW 13.1 11.5 - 15.5 %   Platelets 260 150 - 400 K/uL   nRBC 0.0 0.0 - 0.2 %   Neutrophils Relative % 60 %   Neutro Abs 4.3 1.7 - 7.7 K/uL   Lymphocytes Relative 29 %   Lymphs Abs 2.1 0.7 - 4.0 K/uL   Monocytes Relative 8 %   Monocytes Absolute 0.6 0.1 - 1.0 K/uL   Eosinophils Relative 2 %   Eosinophils Absolute 0.1 0.0 - 0.5 K/uL   Basophils Relative 1 %    Basophils Absolute 0.1 0.0 - 0.1 K/uL   Immature Granulocytes 0 %   Abs Immature Granulocytes 0.02 0.00 - 0.07 K/uL    Comment: Performed at [facility], [address] [place], [place] [address]   Personally reviewed- appendix thickened and inflammation surrounding.   CT ABDOMEN PELVIS W CONTRAST  Result Date: 02/23/2021 CLINICAL DATA:  Right lower quadrant pain EXAM: CT ABDOMEN AND PELVIS WITH CONTRAST TECHNIQUE: Multidetector CT imaging of the abdomen and pelvis was performed using the standard protocol following bolus administration of intravenous contrast. CONTRAST:  OMNIPAQUE IOHEXOL 300 MG/ML  SOLN COMPARISON:  None. FINDINGS: Lower chest: The lung bases are clear. The heart size is normal. Hepatobiliary: The liver is normal. Normal gallbladder.There is no biliary ductal dilation. Pancreas: Normal contours without ductal dilatation. No peripancreatic fluid collection. Spleen: Unremarkable. Adrenals/Urinary Tract: --Adrenal glands: Unremarkable. --Right kidney/ureter: No hydronephrosis or radiopaque kidney stones. --Left kidney/ureter: No hydronephrosis or radiopaque kidney stones. --Urinary bladder: Unremarkable. Stomach/Bowel: --Stomach/Duodenum: No hiatal hernia or other gastric abnormality. Normal duodenal course and caliber. --Small bowel: Unremarkable. --Colon: Rectosigmoid diverticulosis without acute inflammation. --Appendix: There is diffuse circumferential wall thickening of the appendix with the appendix measuring up to approximately 1.4 cm in with. There are periappendiceal inflammatory changes without evidence for free air or a drainable fluid collection. Vascular/Lymphatic: Atherosclerotic calcification is present within the non-aneurysmal abdominal aorta, without hemodynamically significant stenosis. --No retroperitoneal lymphadenopathy. --No mesenteric lymphadenopathy. --No pelvic or inguinal lymphadenopathy. Reproductive: Unremarkable Other: No ascites or free air. There is a  fat containing left inguinal hernia. Musculoskeletal. No acute displaced fractures. IMPRESSION: Acute uncomplicated appendicitis. Electronically Signed   By: Katherine Mantle M.D.   On: 02/23/2021 18:19     Assessment & Plan:  Peter Barajas is a 65 y.o. male with acute  Appendicitis. Discussed the risk of laparoscopic appendectomy and the option of antibiotics alone. Discussed that in [place] and some trials in the [place], antibiotics are used for simple appendicitis. Discussed that research shows a 40% failure rate for antibiotics alone.  Discussed risk of surgery including but not limited to bleeding, infection, injury to other organs, normal appendix, and after this discussion the patient has decided to proceed with surgery.   All questions were answered to the satisfaction of the patient.   Lucretia Roers 02/24/2021, 6:46 AM

## 2021-02-24 NOTE — Transfer of Care (Signed)
22Immediate Anesthesia Transfer of Care Note  Patient: Peter Barajas  Procedure(s) Performed: APPENDECTOMY LAPAROSCOPIC (N/A Abdomen)  Patient Location: PACU  Anesthesia Type:General  Level of Consciousness: awake and patient cooperative  Airway & Oxygen Therapy: Patient Spontanous Breathing  Post-op Assessment: Report given to RN and Post -op Vital signs reviewed and stable 2Post vital signs: Reviewed and stable  Last Vitals:  Vitals Value Taken Time  BP 152/85 02/24/21 0831  Temp 98.3   Pulse 71 02/24/21 0832  Resp 12 02/24/21 0832  SpO2 97 % 02/24/21 0832  Vitals shown include unvalidated device data.  Last Pain:  Vitals:   02/24/21 0711  TempSrc: Oral  PainSc: 0-No pain      Patients Stated Pain Goal: 5 (02/24/21 0711)  Complications: No complications documented.

## 2021-02-24 NOTE — H&P (Signed)
TRH H&P    Patient Demographics:    Peter Barajas, is a 65 y.o. male  MRN: [id number]  DOB - [birth year]  Admit Date - 02/23/2021  Referring MD/NP/PA: Long  Outpatient Primary MD for the patient is Gareth Morgan, MD  Patient coming from: Home  Chief complaint-abdominal pain   HPI:    Peter Barajas  is a 65 y.o. male, with a history of anxiety and depression, reports that he had abdominal pain across the center of his abdomen like a band at the level of the umbilicus, that started 24 hours prior to presentation.  Patient describes the pain as severe pain.  He went to see his PCP in the a.m. they did an exam and told him to come to the hospital for acute appendicitis rule out.  Patient reports that he has a history of diverticulitis, and initially thought that this pain was secondary to that, but he reports that this pain is much more severe than his diverticular pain.  The time he saw his doctor this afternoon the pain had shifted from being bandlike to specifically his right lower quadrant.  Patient reports excruciating pain when his doctor palpated McBurney's point.  Patient describes the pain as an 8 out of 10, but cannot characterize it as a burning, sharp, pressure, cramping, twisted, etc.  Patient reports that his last normal meal was 7 PM the night before the pain started.  His last normal bowel movement was day before last, that is about normal for him.  He denies any nausea or vomiting.  He has no other complaints at this time.  Patient does not smoke, he reports he drinks 2 drinks a day and has never had withdrawal symptoms sometimes he stops for weeks at a time.  He does not use illicit drugs.  He is vaccinated and boosted for COVID.  Patient is full code.  In the ED Temp 97.9, heart rate 125, respiratory rate 20, blood pressure 143/92, satting 100% No leukocytosis with a white blood cell count of 7.8,  hemoglobin 13.2 Chemistry panel reveals nothing remarkable. Negative respiratory panel CT shows acute uncomplicated appendicitis General surgery was consulted from the ED reports the plan is for surgery in the a.m. Patient was started on Rocephin and Flagyl EKG shows a heart rate of 60, sinus rhythm with, QTC 434 Admission requested for management of appendicitis prior to surgery.      Review of systems:    In addition to the HPI above,  No Fever-chills, No Headache, No changes with Vision or hearing, No problems swallowing food or Liquids, No Chest pain, Cough or Shortness of Breath, No Blood in stool or Urine, No dysuria, No new skin rashes or bruises, No new joints pains-aches,  No new weakness, tingling, numbness in any extremity, No recent weight gain or loss, No polyuria, polydypsia or polyphagia, No significant Mental Stressors.  All other systems reviewed and are negative.    Past History of the following :    Past Medical History:  Diagnosis Date  . Anxiety   .  Depression       Past Surgical History:  Procedure Laterality Date  . COLONOSCOPY N/A 01/22/2015   Procedure: COLONOSCOPY;  Surgeon: Malissa HippoNajeeb U Rehman, MD;  Location: [facility];  Service: Endoscopy;  Laterality: N/A;  730  . HERNIA REPAIR     at age 89      Social History:      Social History   Tobacco Use  . Smoking status: Never Smoker  . Smokeless tobacco: Never Used  Substance Use Topics  . Alcohol use: Yes    Alcohol/week: 2.0 - 3.0 standard drinks    Types: 2 - 3 Cans of beer per week    Comment: daily       Family History :     Family History  Problem Relation Age of Onset  . Colon cancer Other       Home Medications:   Prior to Admission medications   Medication Sig Start Date End Date Taking? Authorizing Provider  clonazePAM (KLONOPIN) 1 MG tablet Take 1 mg by mouth 4 (four) times daily.    [provider]  PARoxetine (PAXIL) 20 MG tablet Take 20 mg by  mouth daily.    [provider]     Allergies:    No Known Allergies   Physical Exam:   Vitals  Blood pressure (!) 140/93, pulse 75, temperature 98.7 F (37.1 C), temperature source Oral, resp. rate 18, height 5\' 7"  (1.702 m), weight 83 kg, SpO2 100 %.  1.  General: Patient lying supine in bed with head of bed elevated, no acute distress  2. Psychiatric: Mood and behavior normal for situation, alert and oriented x3, pleasant, cooperative with exam  3. Neurologic: Face is symmetric, speech and language are normal, moves all 4 extremities voluntarily, no acute deficit on limited exam  4. HEENMT:  Head is atraumatic, normocephalic, pupils reactive to light, neck is supple, trachea is midline, mucous membranes are moist  5. Respiratory : Lungs are clear to auscultation bilaterally without wheezes, rhonchi, rales, no increased work of breathing, no accessory muscle use  6. Cardiovascular : Heart rate is normal at the time of my exam, rhythm is regular, no murmurs rubs or gallops, no peripheral edema  7. Gastrointestinal:  Abdomen is soft, nondistended, tender to palpation in the right lower quadrant, none of the other quadrants, no palpable masses  8. Skin:  Skin is warm dry and intact without acute lesion on limited exam  9.Musculoskeletal:  No calf tenderness, peripheral pulses palpated, no peripheral edema    Data Review:    CBC Recent Labs  Lab 02/23/21 1951  WBC 7.8  HGB 13.2  HCT 39.0  PLT 200  MCV 92.9  MCH 31.4  MCHC 33.8  RDW 12.9  LYMPHSABS 1.7  MONOABS 0.6  EOSABS 0.1  BASOSABS 0.0   ------------------------------------------------------------------------------------------------------------------  Results for orders placed or performed during the hospital encounter of 02/23/21 (from the past 48 hour(s))  Comprehensive metabolic panel     Status: Abnormal   Collection Time: 02/23/21  7:51 PM  Result Value Ref Range   Sodium 133 (L) 135  - 145 mmol/L   Potassium 4.2 3.5 - 5.1 mmol/L   Chloride 96 (L) 98 - 111 mmol/L   CO2 27 22 - 32 mmol/L   Glucose, Bld 95 70 - 99 mg/dL    Comment: Glucose reference range applies only to samples taken after fasting for at least 8 hours.   BUN 10 8 - 23 mg/dL  Creatinine, Ser 0.93 0.61 - 1.24 mg/dL   Calcium 9.6 8.9 - 93.2 mg/dL   Total Protein 7.9 6.5 - 8.1 g/dL   Albumin 4.6 3.5 - 5.0 g/dL   AST 21 15 - 41 U/L   ALT 22 0 - 44 U/L   Alkaline Phosphatase 102 38 - 126 U/L   Total Bilirubin 1.0 0.3 - 1.2 mg/dL   GFR, Estimated >67 >12 mL/min    Comment: (NOTE) Calculated using the CKD-EPI Creatinine Equation (2021)    Anion gap 10 5 - 15    Comment: Performed at [facility], [address] [place], [place] [address]  CBC with Differential     Status: Abnormal   Collection Time: 02/23/21  7:51 PM  Result Value Ref Range   WBC 7.8 4.0 - 10.5 K/uL   RBC 4.20 (L) 4.22 - 5.81 MIL/uL   Hemoglobin 13.2 13.0 - 17.0 g/dL   HCT 98.3 38.2 - 50.5 %   MCV 92.9 80.0 - 100.0 fL   MCH 31.4 26.0 - 34.0 pg   MCHC 33.8 30.0 - 36.0 g/dL   RDW 39.7 67.3 - 41.9 %   Platelets 200 150 - 400 K/uL   nRBC 0.0 0.0 - 0.2 %   Neutrophils Relative % 68 %   Neutro Abs 5.3 1.7 - 7.7 K/uL   Lymphocytes Relative 22 %   Lymphs Abs 1.7 0.7 - 4.0 K/uL   Monocytes Relative 8 %   Monocytes Absolute 0.6 0.1 - 1.0 K/uL   Eosinophils Relative 1 %   Eosinophils Absolute 0.1 0.0 - 0.5 K/uL   Basophils Relative 1 %   Basophils Absolute 0.0 0.0 - 0.1 K/uL   Immature Granulocytes 0 %   Abs Immature Granulocytes 0.03 0.00 - 0.07 K/uL    Comment: Performed at [facility], [address], [place], [place] [address]  Resp Panel by RT-PCR (Flu A&B, Covid) Nasopharyngeal Swab     Status: None   Collection Time: 02/23/21  7:51 PM   Specimen: Nasopharyngeal Swab; Nasopharyngeal(NP) swabs in vial transport medium  Result Value Ref Range   SARS Coronavirus 2 by RT PCR NEGATIVE NEGATIVE    Comment: (NOTE) SARS-CoV-2  target nucleic acids are NOT DETECTED.  The SARS-CoV-2 RNA is generally detectable in upper respiratory specimens during the acute phase of infection. The lowest concentration of SARS-CoV-2 viral copies this assay can detect is 138 copies/mL. A negative result does not preclude SARS-Cov-2 infection and should not be used as the sole basis for treatment or other patient management decisions. A negative result may occur with  improper specimen collection/handling, submission of specimen other than nasopharyngeal swab, presence of viral mutation(s) within the areas targeted by this assay, and inadequate number of viral copies(<138 copies/mL). A negative result must be combined with clinical observations, patient history, and epidemiological information. The expected result is Negative.  Fact Sheet for Patients:  BloggerCourse.com  Fact Sheet for Healthcare Providers:  SeriousBroker.it  This test is no t yet approved or cleared by the [place] FDA and  has been authorized for detection and/or diagnosis of SARS-CoV-2 by FDA under an Emergency Use Authorization (EUA). This EUA will remain  in effect (meaning this test can be used) for the duration of the COVID-19 declaration under Section 564(b)(1) of the Act, 21 U.S.C.section 360bbb-3(b)(1), unless the authorization is terminated  or revoked sooner.       Influenza A by PCR NEGATIVE NEGATIVE   Influenza B by PCR NEGATIVE NEGATIVE  Comment: (NOTE) The Xpert Xpress SARS-CoV-2/FLU/RSV plus assay is intended as an aid in the diagnosis of influenza from Nasopharyngeal swab specimens and should not be used as a sole basis for treatment. Nasal washings and aspirates are unacceptable for Xpert Xpress SARS-CoV-2/FLU/RSV testing.  Fact Sheet for Patients: BloggerCourse.com  Fact Sheet for Healthcare Providers: SeriousBroker.it  This  test is not yet approved or cleared by the [place] FDA and has been authorized for detection and/or diagnosis of SARS-CoV-2 by FDA under an Emergency Use Authorization (EUA). This EUA will remain in effect (meaning this test can be used) for the duration of the COVID-19 declaration under Section 564(b)(1) of the Act, 21 U.S.C. section 360bbb-3(b)(1), unless the authorization is terminated or revoked.  Performed at [facility], [address], [place], [place] [address]   Urinalysis, Routine w reflex microscopic Urine, Random     Status: Abnormal   Collection Time: 02/24/21  1:08 AM  Result Value Ref Range   Color, Urine YELLOW YELLOW   APPearance CLEAR CLEAR   Specific Gravity, Urine 1.033 (H) 1.005 - 1.030   pH 5.0 5.0 - 8.0   Glucose, UA NEGATIVE NEGATIVE mg/dL   Hgb urine dipstick SMALL (A) NEGATIVE   Bilirubin Urine NEGATIVE NEGATIVE   Ketones, ur NEGATIVE NEGATIVE mg/dL   Protein, ur NEGATIVE NEGATIVE mg/dL   Nitrite NEGATIVE NEGATIVE   Leukocytes,Ua NEGATIVE NEGATIVE   WBC, UA 0-5 0 - 5 WBC/hpf   Bacteria, UA NONE SEEN NONE SEEN    Comment: Performed at [facility], [address] [place], [place] [address]  Surgical pcr screen     Status: None   Collection Time: 02/24/21  1:08 AM   Specimen: Nasal Mucosa; Nasal Swab  Result Value Ref Range   MRSA, PCR NEGATIVE NEGATIVE   Staphylococcus aureus NEGATIVE NEGATIVE    Comment: (NOTE) The Xpert SA Assay (FDA approved for NASAL specimens in patients 17 years of age and older), is one component of a comprehensive surveillance program. It is not intended to diagnose infection nor to guide or monitor treatment. Performed at [facility], [address] [place], [place] [address]     Chemistries  Recent Labs  Lab 02/23/21 1511 02/23/21 1951  NA  --  133*  K  --  4.2  CL  --  96*  CO2  --  27  GLUCOSE  --  95  BUN  --  10  CREATININE 0.90 0.93  CALCIUM  --  9.6  AST  --  21  ALT  --  22  ALKPHOS  --  102   BILITOT  --  1.0   ------------------------------------------------------------------------------------------------------------------  ------------------------------------------------------------------------------------------------------------------ GFR: Estimated Creatinine Clearance: 84.9 mL/min (by C-G formula based on SCr of 0.93 mg/dL). Liver Function Tests: Recent Labs  Lab 02/23/21 1951  AST 21  ALT 22  ALKPHOS 102  BILITOT 1.0  PROT 7.9  ALBUMIN 4.6   No results for input(s): LIPASE, AMYLASE in the last 168 hours. No results for input(s): AMMONIA in the last 168 hours. Coagulation Profile: No results for input(s): INR, PROTIME in the last 168 hours. Cardiac Enzymes: No results for input(s): CKTOTAL, CKMB, CKMBINDEX, TROPONINI in the last 168 hours. BNP (last 3 results) No results for input(s): PROBNP in the last 8760 hours. HbA1C: No results for input(s): HGBA1C in the last 72 hours. CBG: No results for input(s): GLUCAP in the last 168 hours. Lipid Profile: No results for input(s): CHOL, HDL, LDLCALC, TRIG, CHOLHDL, LDLDIRECT in the  last 72 hours. Thyroid Function Tests: No results for input(s): TSH, T4TOTAL, FREET4, T3FREE, THYROIDAB in the last 72 hours. Anemia Panel: No results for input(s): VITAMINB12, FOLATE, FERRITIN, TIBC, IRON, RETICCTPCT in the last 72 hours.  --------------------------------------------------------------------------------------------------------------- Urine analysis:    Component Value Date/Time   COLORURINE YELLOW 02/24/2021 0108   APPEARANCEUR CLEAR 02/24/2021 0108   LABSPEC 1.033 (H) 02/24/2021 0108   PHURINE 5.0 02/24/2021 0108   GLUCOSEU NEGATIVE 02/24/2021 0108   HGBUR SMALL (A) 02/24/2021 0108   BILIRUBINUR NEGATIVE 02/24/2021 0108   KETONESUR NEGATIVE 02/24/2021 0108   PROTEINUR NEGATIVE 02/24/2021 0108   NITRITE NEGATIVE 02/24/2021 0108   LEUKOCYTESUR NEGATIVE 02/24/2021 0108      Imaging Results:    CT ABDOMEN  PELVIS W CONTRAST  Result Date: 02/23/2021 CLINICAL DATA:  Right lower quadrant pain EXAM: CT ABDOMEN AND PELVIS WITH CONTRAST TECHNIQUE: Multidetector CT imaging of the abdomen and pelvis was performed using the standard protocol following bolus administration of intravenous contrast. CONTRAST:  OMNIPAQUE IOHEXOL 300 MG/ML  SOLN COMPARISON:  None. FINDINGS: Lower chest: The lung bases are clear. The heart size is normal. Hepatobiliary: The liver is normal. Normal gallbladder.There is no biliary ductal dilation. Pancreas: Normal contours without ductal dilatation. No peripancreatic fluid collection. Spleen: Unremarkable. Adrenals/Urinary Tract: --Adrenal glands: Unremarkable. --Right kidney/ureter: No hydronephrosis or radiopaque kidney stones. --Left kidney/ureter: No hydronephrosis or radiopaque kidney stones. --Urinary bladder: Unremarkable. Stomach/Bowel: --Stomach/Duodenum: No hiatal hernia or other gastric abnormality. Normal duodenal course and caliber. --Small bowel: Unremarkable. --Colon: Rectosigmoid diverticulosis without acute inflammation. --Appendix: There is diffuse circumferential wall thickening of the appendix with the appendix measuring up to approximately 1.4 cm in with. There are periappendiceal inflammatory changes without evidence for free air or a drainable fluid collection. Vascular/Lymphatic: Atherosclerotic calcification is present within the non-aneurysmal abdominal aorta, without hemodynamically significant stenosis. --No retroperitoneal lymphadenopathy. --No mesenteric lymphadenopathy. --No pelvic or inguinal lymphadenopathy. Reproductive: Unremarkable Other: No ascites or free air. There is a fat containing left inguinal hernia. Musculoskeletal. No acute displaced fractures. IMPRESSION: Acute uncomplicated appendicitis. Electronically Signed   By: Katherine Mantle M.D.   On: 02/23/2021 18:19       Assessment & Plan:    Principal Problem:   Acute appendicitis Active  Problems:   Hyponatremia   Anxiety and depression   1. Acute appendicitis 1. Continue Rocephin and Flagyl 2. Maintenance fluids 3. N.p.o. 4. Morphine for pain 5. Plan for surgery in the a.m. 2. Hyponatremia 1. Secondary to poor p.o. intake 2. Mild at 133 3. Continue maintenance fluids 4. Trend in the a.m. 3. Anxiety and depression 1. Continue Klonopin and Paxil   DVT Prophylaxis-   Heparin- SCDs  AM Labs Ordered, also please review Full Orders  Family Communication: No family at bedside Code Status: Full  Admission status: Inpatient :The appropriate admission status for this patient is INPATIENT. Inpatient status is judged to be reasonable and necessary in order to provide the required intensity of service to ensure the patient's safety. The patient's presenting symptoms, physical exam findings, and initial radiographic and laboratory data in the context of their chronic comorbidities is felt to place them at high risk for further clinical deterioration. Furthermore, it is not anticipated that the patient will be medically stable for discharge from the hospital within 2 midnights of admission. The following factors support the admission status of inpatient.     The patient's presenting symptoms include abdominal pain The worrisome physical exam findings include abdominal tenderness. The initial radiographic and  laboratory data are worrisome because of acute appendicitis on CT The chronic co-morbidities include anxiety and depression       * I certify that at the point of admission it is my clinical judgment that the patient will require inpatient hospital care spanning beyond 2 midnights from the point of admission due to high intensity of service, high risk for further deterioration and high frequency of surveillance required.*  Time spent in minutes : 62   Tilak Oakley B Zierle-Ghosh DO

## 2021-02-24 NOTE — Op Note (Signed)
[facility]  Date of Surgery: 02/24/2021  Admit Date: 02/23/2021   Performing Service: General  Surgeon(s) and Role:    * Lucretia Roers, MD - Primary   Pre-operative Diagnosis: Acute Appendicitis  Post-operative Diagnosis: Acute Appendicitis  Procedure Performed: Laparoscopic Appendectomy   Surgeon: Leatrice Jewels. Henreitta Leber, MD   Assistant: No qualified resident was available.   Anesthesia: General   Findings:  The appendix was found to be inflamed. There were not signs of necrosis. There was not perforation. There was not abscess formation.   Estimated Blood Loss: Minimal   Specimens:  ID Type Source Tests Collected by Time Destination  1 : Appendix Tissue PATH Appendix SURGICAL PATHOLOGY Lucretia Roers, MD 02/24/2021 0800      Complications: None; patient tolerated the procedure well.   Disposition: PACU - hemodynamically stable.   Condition: stable   Indications: The patient presented with a 1 day history of right-sided abdominal pain. A CT revealed findings consistent with acute appendicitis.   Procedure Details  Prior to the procedure, the risks, benefits, complications, treatment options, and expected outcomes were discussed with the patient and/or family, including but not limited to the risk of bleeding, infection, finding of a normal appendix, and the need for conversion to an open procedure. There was concurrence with the proposed plan and informed consent was obtained. The patient was taken to the operating room, identified as Peter Barajas and the procedure verified as Laproscopic Appendectomy.    The patient was placed in the supine position and general anesthesia was induced, along with placement of orogastric tube, SCD's, and a Foley catheter. The abdomen was prepped and draped in a sterile fashion. The abdomen was entered with Veress technique in the infraumbilical incision. Intraperitoneal placement was confirmed with saline drop, low entry  pressures, and easy insufflation. A 11 mm optiview trocar was placed under direct visualization with a 0 degree scope. The 10 mm 0 degree scope was placed in the abdomen and no evidence of injury was identified. A 12 mm port was placed in the left lower quadrant of the abdomen after skin incision with trocar placement under direct vision. A careful evaluation of the entire abdomen was carried out. An additional 5 mm port was placed in the suprapubic area under direct vision.  The patient was placed in Trendelenburg and left lateral decubitus position. The small intestines were retracted in the cephalad and left lateral direction away from the pelvis and right lower quadrant. The patient was found to have a dilated and inflamed appendix. There was not evidence of perforation.   The appendix was carefully dissected. A window was made in the mesoappendix at the base of the appendix. The appendix was divided at its base using a standard endo-GIA stapler. Minimal appendiceal stump was left in place. The mesoappendix was taken with the harmonic energy device. The appendix was placed within an Endocatch specimen bag. There was no evidence of bleeding, leakage, or complication after division of the appendix.  Any remaining blood or pus was suctioned out from the abdomen, hemostasis was confirmed. The endocatch bag was removed via the 12 mm port, then the abdomen desufflated. The appendix was passed off the field as a specimen.   The the 12 mm and 10 mm port sites were closed with a 0 Vicryl suture. The trocar site skin wounds were closed using subcuticular 4-0 Monocryl suture and dermabond. The patient was then awakened from general anesthesia, extubated, and taken to PACU for recovery.  Instrument, sponge, and needle counts were correct at the conclusion of the case.   Curlene Labrum, MD [facility] [address] [place], [place] [address] [phone]([id number])

## 2021-02-24 NOTE — Discharge Summary (Signed)
Physician Discharge Summary  Patient ID: Peter Barajas MRN: [id number] DOB/AGE: [birth year] 63 y.o.  Admit date: 02/23/2021 Discharge date: 02/26/2021  Admission Diagnoses: Acute appendicitis   Discharge Diagnoses:  Principal Problem:   Acute appendicitis Active Problems:   Hyponatremia   Anxiety and depression   Discharged Condition: good  Hospital Course: Mr. Mercer was admitted overnight by the hospitalist for acute appendicitis and started on antibiotics. He had a laparoscopic appendectomy 02/24/2021 and was able to be discharged later that day after eating a diet, having pain control and moving.   Consults:  Hospitalist admission, transferred to surgery in AM  Significant Diagnostic Studies: CT with appendicitis   Treatments: IV hydration, antibiotics: Zosyn, and 5/3 laparoscopic appendectomy  Discharge Exam: Blood pressure 126/74, pulse 66, temperature 97.6 F (36.4 C), temperature source Oral, resp. rate 17, height 5\' 7"  (1.702 m), weight 182 lb 15.7 oz (83 kg), SpO2 98 %.   Disposition: Discharge disposition: 01-Home or Self Care      Discharge Instructions     Call MD for:  difficulty breathing, headache or visual disturbances   Complete by: As directed    Call MD for:  extreme fatigue   Complete by: As directed    Call MD for:  persistant dizziness or light-headedness   Complete by: As directed    Call MD for:  persistant nausea and vomiting   Complete by: As directed    Call MD for:  redness, tenderness, or signs of infection (pain, swelling, redness, odor or green/yellow discharge around incision site)   Complete by: As directed    Call MD for:  severe uncontrolled pain   Complete by: As directed    Call MD for:  temperature >100.4   Complete by: As directed    Increase activity slowly   Complete by: As directed       Allergies as of 02/24/2021   No Known Allergies      Medication List     TAKE these medications    clonazePAM 1 MG  tablet Commonly known as: KLONOPIN Take 1 mg by mouth 4 (four) times daily.   ondansetron 4 MG tablet Commonly known as: ZOFRAN Take 1 tablet (4 mg total) by mouth every 6 (six) hours as needed for nausea.   oxyCODONE 5 MG immediate release tablet Commonly known as: Oxy IR/ROXICODONE Take 1 tablet (5 mg total) by mouth every 4 (four) hours as needed for severe pain or breakthrough pain.   PARoxetine 20 MG tablet Commonly known as: PAXIL Take 20 mg by mouth daily.        Follow-up Information     04/26/2021, MD Follow up on 03/12/2021.   Specialty: General Surgery Why: phone call post op if you ned to be seen in person call the office  Contact information: [address] [facility] [phone]                 Signed: [phone] 02/26/2021, 9:57 AM

## 2021-02-24 NOTE — Anesthesia Preprocedure Evaluation (Signed)
Anesthesia Evaluation  Patient identified by MRN, date of birth, ID band Patient awake    Reviewed: Allergy & Precautions, NPO status , Patient's Chart, lab work & pertinent test results  History of Anesthesia Complications Negative for: history of anesthetic complications  Airway Mallampati: III  TM Distance: >3 FB Neck ROM: Full    Dental  (+) Dental Advisory Given, Chipped   Pulmonary    Pulmonary exam normal breath sounds clear to auscultation       Cardiovascular Exercise Tolerance: Good Normal cardiovascular exam Rhythm:Regular Rate:Normal     Neuro/Psych PSYCHIATRIC DISORDERS Anxiety Depression    GI/Hepatic negative GI ROS, Neg liver ROS,   Endo/Other  negative endocrine ROS  Renal/GU negative Renal ROS     Musculoskeletal negative musculoskeletal ROS (+)   Abdominal   Peds  Hematology negative hematology ROS (+)   Anesthesia Other Findings   Reproductive/Obstetrics negative OB ROS                            Anesthesia Physical Anesthesia Plan  ASA: II  Anesthesia Plan: General   Post-op Pain Management:    Induction: Intravenous  PONV Risk Score and Plan: 4 or greater and Ondansetron, Dexamethasone and Midazolam  Airway Management Planned: Oral ETT  Additional Equipment:   Intra-op Plan:   Post-operative Plan: Extubation in OR  Informed Consent: I have reviewed the patients History and Physical, chart, labs and discussed the procedure including the risks, benefits and alternatives for the proposed anesthesia with the patient or authorized representative who has indicated his/her understanding and acceptance.     Dental advisory given  Plan Discussed with: CRNA and Surgeon  Anesthesia Plan Comments:         Anesthesia Quick Evaluation

## 2021-02-25 ENCOUNTER — Encounter (HOSPITAL_COMMUNITY): Payer: Self-pay | Admitting: General Surgery

## 2021-02-25 LAB — SURGICAL PATHOLOGY

## 2021-03-12 ENCOUNTER — Ambulatory Visit (INDEPENDENT_AMBULATORY_CARE_PROVIDER_SITE_OTHER): Payer: Self-pay | Admitting: General Surgery

## 2021-03-12 DIAGNOSIS — K353 Acute appendicitis with localized peritonitis, without perforation or gangrene: Secondary | ICD-10-CM

## 2021-03-12 NOTE — Progress Notes (Signed)
[facility]  I am calling the patient for post operative evaluation. This is not a billable encounter as it is under the global charges for the surgery.  The patient had a laparoscopic appendectomy on 02/23/2021. The patient reports that he is doing well. The are tolerating a diet, having good pain control, and having regular Bms.  The incisions are healing well. The patient has no concerns.   Pathology: Clinical History: acute appendicitis   FINAL MICROSCOPIC DIAGNOSIS:   A. APPENDIX, APPENDECTOMY:  - Acute appendicitis with serositis   Will see the patient PRN.   Algis Greenhouse, MD [facility] [address]ella R[address] [place], [place] [address] [phone] (office)

## 2022-12-02 IMAGING — CT CT ABD-PELV W/ CM
2 of 5 series · 16 of 46 positions shown, 18 images · IV contrast (Omnipaque or Isovue)
Comparison: None.

CLINICAL DATA: Right lower quadrant pain

EXAM:
CT ABDOMEN AND PELVIS WITH CONTRAST
TECHNIQUE: Multidetector CT imaging of the abdomen and pelvis was performed
using the standard protocol following bolus administration of
intravenous contrast.
CONTRAST:  100mL OMNIPAQUE IOHEXOL 300 MG/ML  SOLN

[Series 2: axial st · axial · 0.80mm/px · z∈[+643,+1108]mm · 13 of 107 slices shown, 15 images]
[im 7/107  soft-tissue]
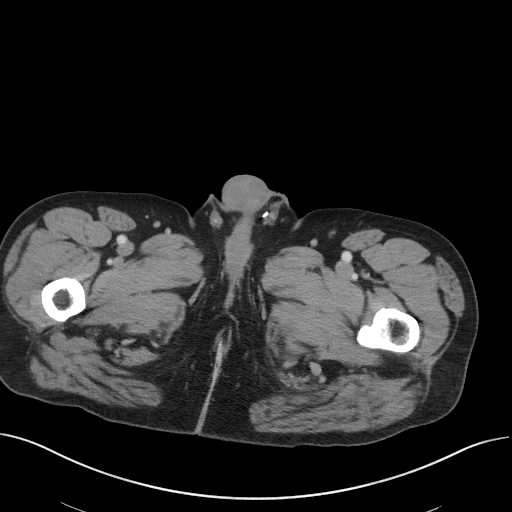
[im 7/107  bone]
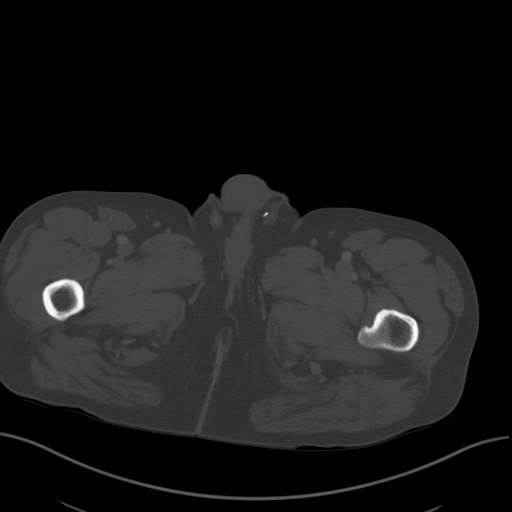
[im 14/107  soft-tissue]
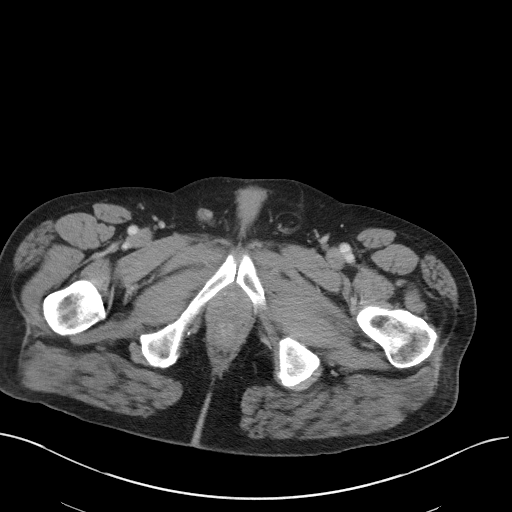
[im 20/107  soft-tissue]
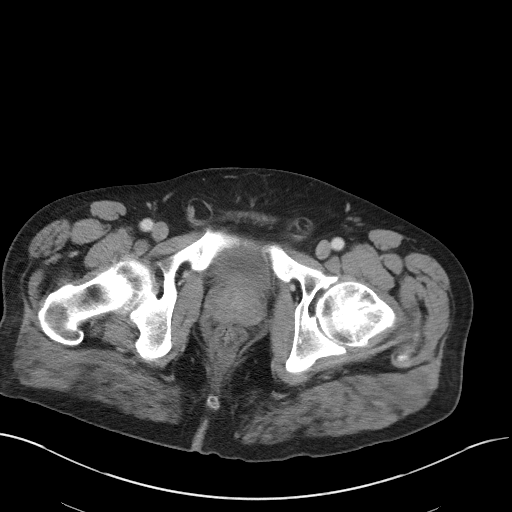
[im 34/107  soft-tissue]
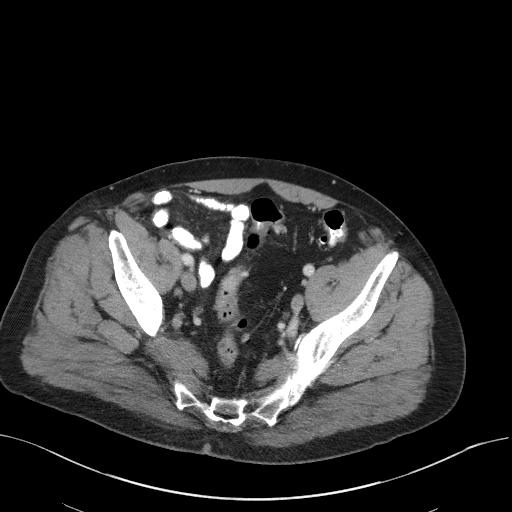
[im 40/107  soft-tissue]
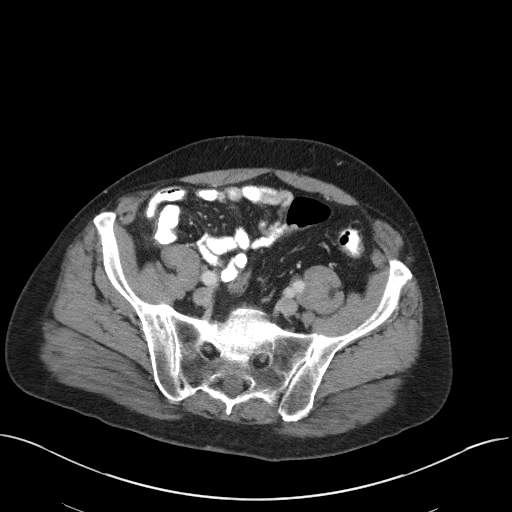
[im 47/107  soft-tissue]
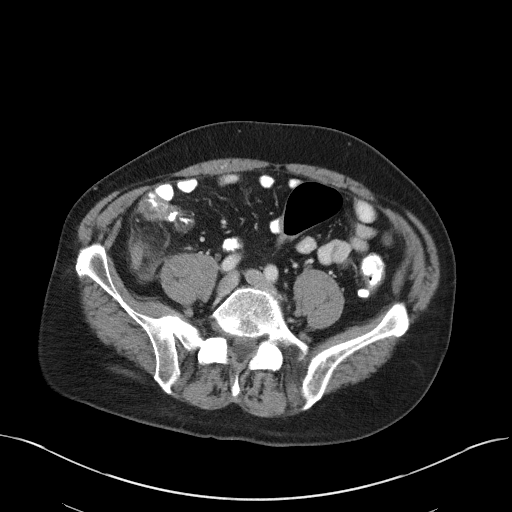
[im 54/107  soft-tissue]
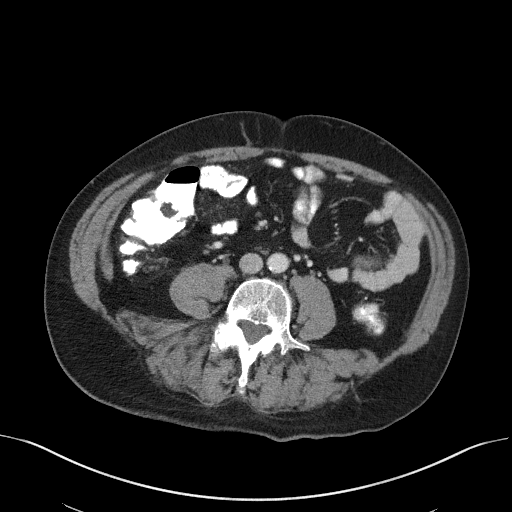
[im 60/107  soft-tissue]
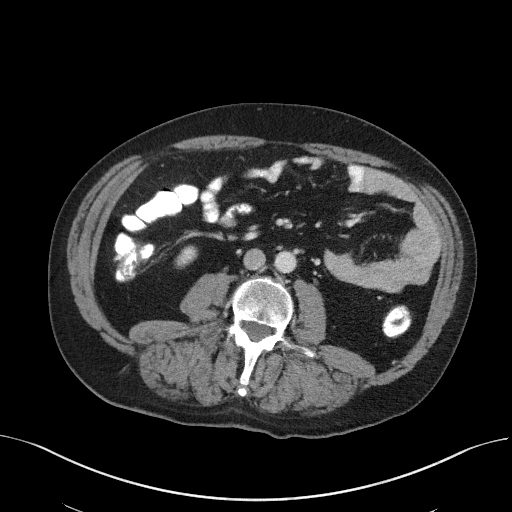
[im 67/107  soft-tissue]
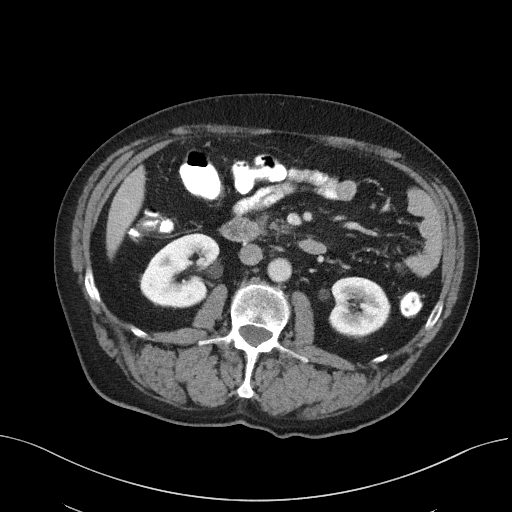
[im 67/107  bone]
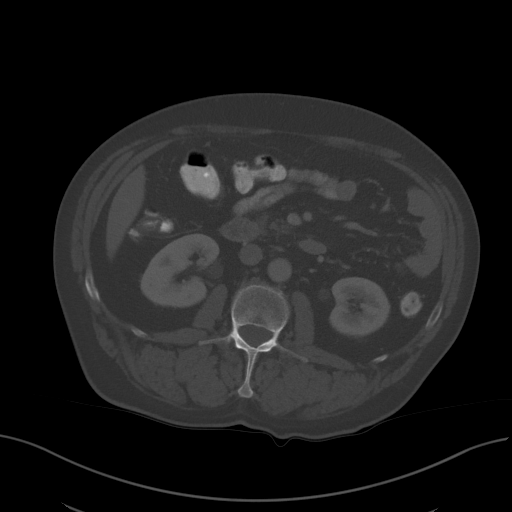
[im 73/107  soft-tissue]
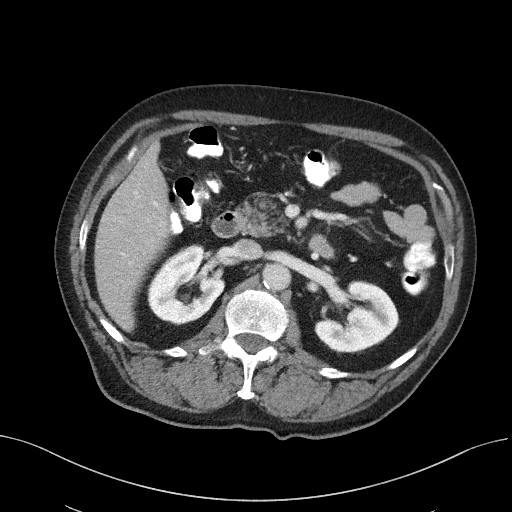
[im 87/107  soft-tissue]
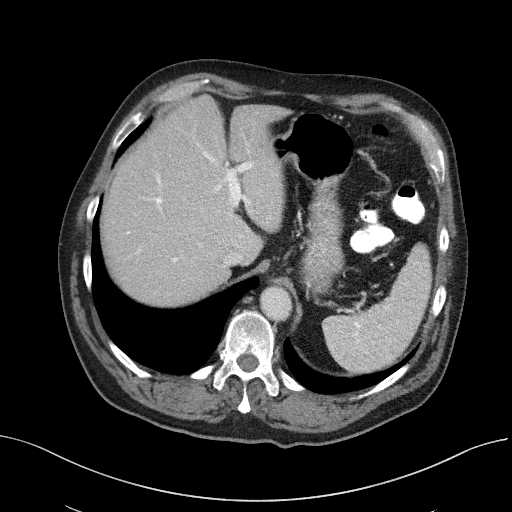
[im 93/107  soft-tissue]
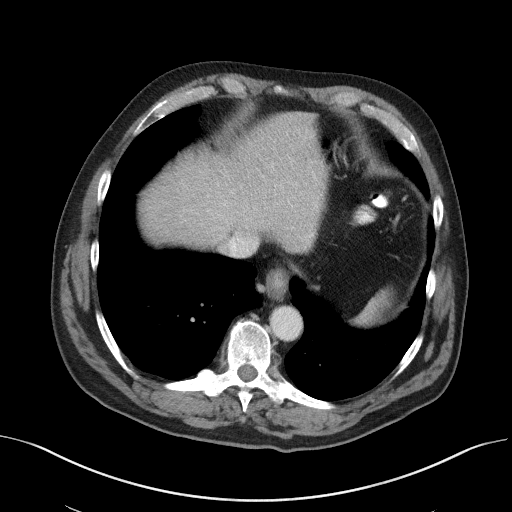
[im 100/107  soft-tissue]
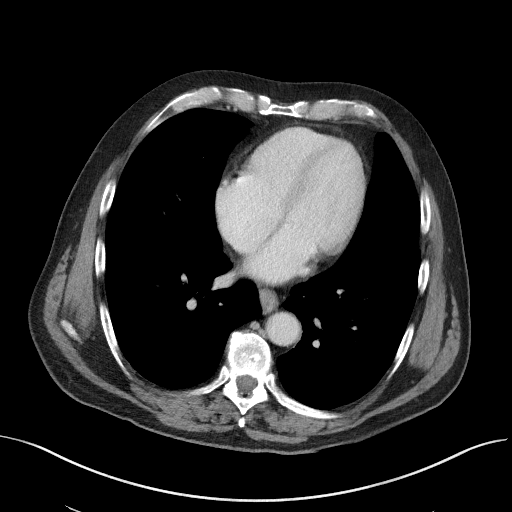

[Series 6: coronal st · coronal · 0.81mm/px · 3 of 116 slices shown]
[im 39/116  soft-tissue]
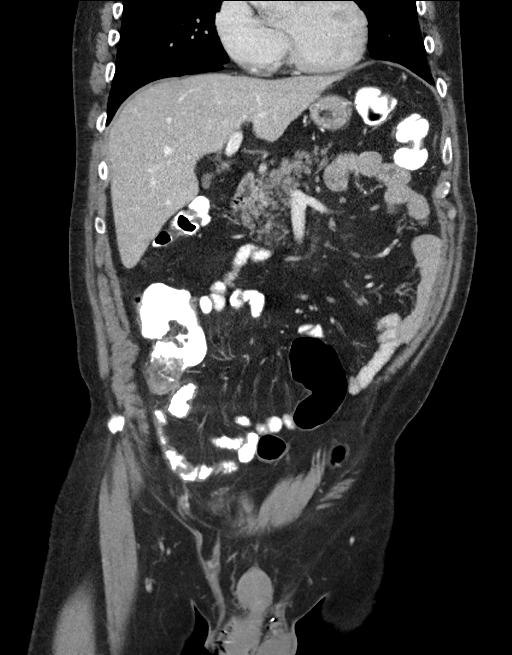
[im 52/116  soft-tissue]
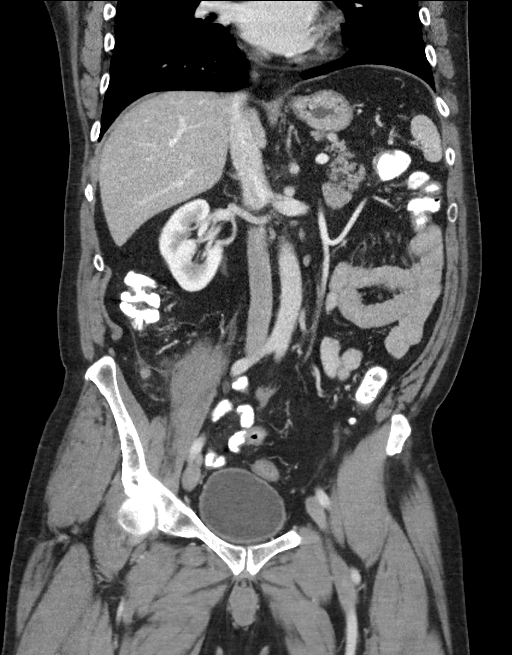
[im 64/116  soft-tissue]
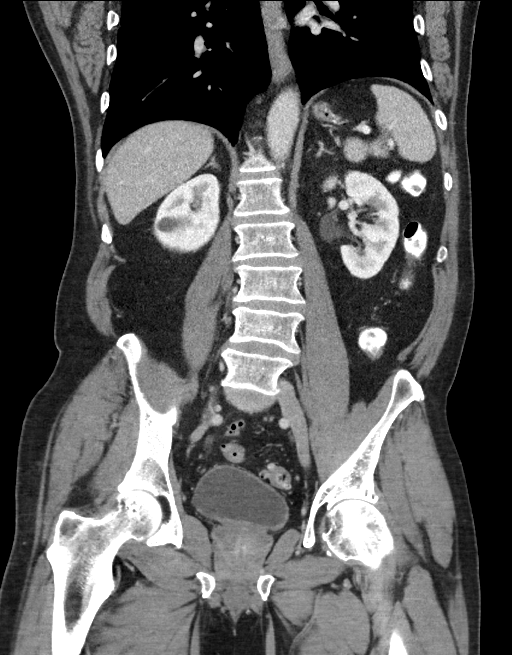

[16 of 46 positions shown; findings below may reference images not displayed]

FINDINGS: Lower chest: The lung bases are clear. The heart size is normal.

Hepatobiliary: The liver is normal. Normal gallbladder.There is no
biliary ductal dilation.

Pancreas: Normal contours without ductal dilatation. No
peripancreatic fluid collection.

Spleen: Unremarkable.

Adrenals/Urinary Tract:

--Adrenal glands: Unremarkable.

--Right kidney/ureter: No hydronephrosis or radiopaque kidney
stones.

--Left kidney/ureter: No hydronephrosis or radiopaque kidney stones.

--Urinary bladder: Unremarkable.

Stomach/Bowel:

--Stomach/Duodenum: No hiatal hernia or other gastric abnormality.
Normal duodenal course and caliber.

--Small bowel: Unremarkable.

--Colon: Rectosigmoid diverticulosis without acute inflammation.

--Appendix: There is diffuse circumferential wall thickening of the
appendix with the appendix measuring up to approximately 1.4 cm in
with. There are periappendiceal inflammatory changes without
evidence for free air or a drainable fluid collection.

Vascular/Lymphatic: Atherosclerotic calcification is present within
the non-aneurysmal abdominal aorta, without hemodynamically
significant stenosis.

--No retroperitoneal lymphadenopathy.

--No mesenteric lymphadenopathy.

--No pelvic or inguinal lymphadenopathy.

Reproductive: Unremarkable

Other: No ascites or free air. There is a fat containing left
inguinal hernia.

Musculoskeletal. No acute displaced fractures.
IMPRESSION: Acute uncomplicated appendicitis.

## 2023-05-31 ENCOUNTER — Telehealth: Payer: Self-pay

## 2023-05-31 NOTE — Telephone Encounter (Signed)
Client returned call, he states he has been accepted as a patient with Dr. Catalina Pizza in [place] and his first appointment will be 08/15/23. He also has a dental appointment with [facility] on a sliding scale basis.  He is doing well, awaiting Medicaid decision and according to A. McCray his 45 day wait period would be around 06/06/23.    He has no other needs at this time.   Francee Nodal RN [employer]

## 2023-05-31 NOTE — Telephone Encounter (Signed)
Attempted call for follow up/wellness. He was to have his last appointment with Dr. Sudie Bailey in July. He will be Medicare eligible in December. No answer today, left message requesting a return call.   Francee Nodal RN [employer]

## 2023-06-09 ENCOUNTER — Telehealth: Payer: Self-pay

## 2023-06-09 NOTE — Telephone Encounter (Signed)
[facility] client called to check on the status of His Medicaid as he had gotten calls today. Confirmed by [employer] that client has been approved for Medicaid and discussed that client can come into the office to learn more about all the plans. He will come next week on 06/15/23 unsure of exact time. Discussed with A. McCray. Will scan documents into [employer] and update his [employer] enrollment as he now has insurance  Client has been accepted to Dr. Catalina Pizza from primary care and is made aware that his Medicaid has gone into affect, and they should mail out a packet and his card to him at his address.  Francee Nodal RN [employer]
